# Patient Record
Sex: Female | Born: 1945 | Race: White | Hispanic: No | Smoking: Never smoker
Health system: Southern US, Community
[De-identification: ages and names within clinical notes are randomized; demographics above are authoritative.]

## PROBLEM LIST (undated history)

## (undated) DIAGNOSIS — E7800 Pure hypercholesterolemia, unspecified: Secondary | ICD-10-CM

## (undated) DIAGNOSIS — I10 Essential (primary) hypertension: Secondary | ICD-10-CM

## (undated) DIAGNOSIS — F32A Depression, unspecified: Secondary | ICD-10-CM

## (undated) DIAGNOSIS — F329 Major depressive disorder, single episode, unspecified: Secondary | ICD-10-CM

## (undated) DIAGNOSIS — J302 Other seasonal allergic rhinitis: Secondary | ICD-10-CM

## (undated) DIAGNOSIS — R195 Other fecal abnormalities: Secondary | ICD-10-CM

## (undated) HISTORY — DX: Essential (primary) hypertension: I10

## (undated) HISTORY — PX: OTHER SURGICAL HISTORY: SHX169

## (undated) HISTORY — DX: Other fecal abnormalities: R19.5

## (undated) HISTORY — PX: LAPAROSCOPY: SHX197

## (undated) HISTORY — PX: BREAST EXCISIONAL BIOPSY: SUR124

---

## 2007-08-02 ENCOUNTER — Ambulatory Visit (HOSPITAL_COMMUNITY): Admission: RE | Admit: 2007-08-02 | Discharge: 2007-08-02 | Payer: Self-pay | Admitting: Family Medicine

## 2007-12-28 ENCOUNTER — Encounter: Admission: RE | Admit: 2007-12-28 | Discharge: 2007-12-28 | Payer: Self-pay | Admitting: Otolaryngology

## 2007-12-28 ENCOUNTER — Other Ambulatory Visit: Admission: RE | Admit: 2007-12-28 | Discharge: 2007-12-28 | Payer: Self-pay | Admitting: Interventional Radiology

## 2007-12-28 ENCOUNTER — Encounter (INDEPENDENT_AMBULATORY_CARE_PROVIDER_SITE_OTHER): Payer: Self-pay | Admitting: Interventional Radiology

## 2010-05-07 ENCOUNTER — Encounter: Admission: RE | Admit: 2010-05-07 | Discharge: 2010-05-07 | Payer: Self-pay | Admitting: Family Medicine

## 2010-11-10 ENCOUNTER — Ambulatory Visit: Payer: BC Managed Care – PPO | Attending: Orthopaedic Surgery | Admitting: Physical Therapy

## 2010-11-10 DIAGNOSIS — IMO0001 Reserved for inherently not codable concepts without codable children: Secondary | ICD-10-CM | POA: Insufficient documentation

## 2010-11-10 DIAGNOSIS — M25669 Stiffness of unspecified knee, not elsewhere classified: Secondary | ICD-10-CM | POA: Insufficient documentation

## 2010-11-10 DIAGNOSIS — R5381 Other malaise: Secondary | ICD-10-CM | POA: Insufficient documentation

## 2010-11-10 DIAGNOSIS — M25569 Pain in unspecified knee: Secondary | ICD-10-CM | POA: Insufficient documentation

## 2010-11-13 ENCOUNTER — Ambulatory Visit: Payer: BC Managed Care – PPO | Admitting: Physical Therapy

## 2010-11-17 ENCOUNTER — Encounter: Payer: BC Managed Care – PPO | Admitting: Physical Therapy

## 2010-11-19 ENCOUNTER — Encounter: Payer: BC Managed Care – PPO | Admitting: Physical Therapy

## 2011-04-14 ENCOUNTER — Other Ambulatory Visit: Payer: Self-pay | Admitting: Family Medicine

## 2011-04-14 DIAGNOSIS — Z1231 Encounter for screening mammogram for malignant neoplasm of breast: Secondary | ICD-10-CM

## 2011-05-11 ENCOUNTER — Ambulatory Visit
Admission: RE | Admit: 2011-05-11 | Discharge: 2011-05-11 | Disposition: A | Discharge status: Home / Self Care | Payer: BC Managed Care – PPO | Source: Ambulatory Visit | Attending: Family Medicine | Admitting: Family Medicine

## 2011-05-11 DIAGNOSIS — Z1231 Encounter for screening mammogram for malignant neoplasm of breast: Secondary | ICD-10-CM

## 2012-04-06 ENCOUNTER — Other Ambulatory Visit: Payer: Self-pay | Admitting: Family Medicine

## 2012-04-06 DIAGNOSIS — Z1231 Encounter for screening mammogram for malignant neoplasm of breast: Secondary | ICD-10-CM

## 2012-05-11 ENCOUNTER — Ambulatory Visit
Admission: RE | Admit: 2012-05-11 | Discharge: 2012-05-11 | Disposition: A | Discharge status: Home / Self Care | Payer: BC Managed Care – PPO | Source: Ambulatory Visit | Attending: Family Medicine | Admitting: Family Medicine

## 2012-05-11 DIAGNOSIS — Z1231 Encounter for screening mammogram for malignant neoplasm of breast: Secondary | ICD-10-CM

## 2012-10-18 DIAGNOSIS — E559 Vitamin D deficiency, unspecified: Secondary | ICD-10-CM | POA: Diagnosis not present

## 2012-10-18 DIAGNOSIS — E7800 Pure hypercholesterolemia, unspecified: Secondary | ICD-10-CM | POA: Diagnosis not present

## 2012-10-18 DIAGNOSIS — D529 Folate deficiency anemia, unspecified: Secondary | ICD-10-CM | POA: Diagnosis not present

## 2012-10-18 DIAGNOSIS — R002 Palpitations: Secondary | ICD-10-CM | POA: Diagnosis not present

## 2012-10-18 DIAGNOSIS — R5383 Other fatigue: Secondary | ICD-10-CM | POA: Diagnosis not present

## 2012-10-18 DIAGNOSIS — I10 Essential (primary) hypertension: Secondary | ICD-10-CM | POA: Diagnosis not present

## 2012-10-18 DIAGNOSIS — E539 Vitamin B deficiency, unspecified: Secondary | ICD-10-CM | POA: Diagnosis not present

## 2012-10-24 DIAGNOSIS — E7800 Pure hypercholesterolemia, unspecified: Secondary | ICD-10-CM | POA: Diagnosis not present

## 2012-10-24 DIAGNOSIS — I10 Essential (primary) hypertension: Secondary | ICD-10-CM | POA: Diagnosis not present

## 2012-10-24 DIAGNOSIS — J309 Allergic rhinitis, unspecified: Secondary | ICD-10-CM | POA: Diagnosis not present

## 2012-10-24 DIAGNOSIS — R5383 Other fatigue: Secondary | ICD-10-CM | POA: Diagnosis not present

## 2012-10-24 DIAGNOSIS — R5381 Other malaise: Secondary | ICD-10-CM | POA: Diagnosis not present

## 2013-02-22 DIAGNOSIS — I10 Essential (primary) hypertension: Secondary | ICD-10-CM | POA: Diagnosis not present

## 2013-02-22 DIAGNOSIS — E7800 Pure hypercholesterolemia, unspecified: Secondary | ICD-10-CM | POA: Diagnosis not present

## 2013-03-06 DIAGNOSIS — J309 Allergic rhinitis, unspecified: Secondary | ICD-10-CM | POA: Diagnosis not present

## 2013-03-06 DIAGNOSIS — I10 Essential (primary) hypertension: Secondary | ICD-10-CM | POA: Diagnosis not present

## 2013-03-06 DIAGNOSIS — E7800 Pure hypercholesterolemia, unspecified: Secondary | ICD-10-CM | POA: Diagnosis not present

## 2013-03-06 DIAGNOSIS — R5381 Other malaise: Secondary | ICD-10-CM | POA: Diagnosis not present

## 2013-03-06 DIAGNOSIS — Z23 Encounter for immunization: Secondary | ICD-10-CM | POA: Diagnosis not present

## 2013-03-07 ENCOUNTER — Other Ambulatory Visit (HOSPITAL_COMMUNITY): Payer: Self-pay | Admitting: General Practice

## 2013-03-07 DIAGNOSIS — M8580 Other specified disorders of bone density and structure, unspecified site: Secondary | ICD-10-CM

## 2013-03-13 ENCOUNTER — Other Ambulatory Visit (HOSPITAL_COMMUNITY): Payer: BC Managed Care – PPO

## 2013-03-28 ENCOUNTER — Ambulatory Visit (HOSPITAL_COMMUNITY)
Admission: RE | Admit: 2013-03-28 | Discharge: 2013-03-28 | Disposition: A | Discharge status: Home / Self Care | Payer: Medicare Other | Source: Ambulatory Visit | Attending: General Practice | Admitting: General Practice

## 2013-03-28 DIAGNOSIS — Z780 Asymptomatic menopausal state: Secondary | ICD-10-CM | POA: Diagnosis not present

## 2013-03-28 DIAGNOSIS — Z13820 Encounter for screening for osteoporosis: Secondary | ICD-10-CM | POA: Diagnosis not present

## 2013-03-28 DIAGNOSIS — M8580 Other specified disorders of bone density and structure, unspecified site: Secondary | ICD-10-CM

## 2013-04-13 ENCOUNTER — Other Ambulatory Visit: Payer: Self-pay

## 2013-04-13 DIAGNOSIS — Z1231 Encounter for screening mammogram for malignant neoplasm of breast: Secondary | ICD-10-CM

## 2013-05-08 DIAGNOSIS — H8110 Benign paroxysmal vertigo, unspecified ear: Secondary | ICD-10-CM | POA: Diagnosis not present

## 2013-05-14 ENCOUNTER — Ambulatory Visit
Admission: RE | Admit: 2013-05-14 | Discharge: 2013-05-14 | Disposition: A | Discharge status: Home / Self Care | Payer: Medicare Other | Source: Ambulatory Visit

## 2013-05-14 DIAGNOSIS — Z1231 Encounter for screening mammogram for malignant neoplasm of breast: Secondary | ICD-10-CM | POA: Diagnosis not present

## 2013-07-13 DIAGNOSIS — R5381 Other malaise: Secondary | ICD-10-CM | POA: Diagnosis not present

## 2013-07-13 DIAGNOSIS — R5383 Other fatigue: Secondary | ICD-10-CM | POA: Diagnosis not present

## 2013-07-13 DIAGNOSIS — E7800 Pure hypercholesterolemia, unspecified: Secondary | ICD-10-CM | POA: Diagnosis not present

## 2013-07-13 DIAGNOSIS — I10 Essential (primary) hypertension: Secondary | ICD-10-CM | POA: Diagnosis not present

## 2013-07-20 DIAGNOSIS — E7800 Pure hypercholesterolemia, unspecified: Secondary | ICD-10-CM | POA: Diagnosis not present

## 2013-07-20 DIAGNOSIS — N189 Chronic kidney disease, unspecified: Secondary | ICD-10-CM | POA: Diagnosis not present

## 2013-07-20 DIAGNOSIS — I10 Essential (primary) hypertension: Secondary | ICD-10-CM | POA: Diagnosis not present

## 2013-07-20 DIAGNOSIS — R5383 Other fatigue: Secondary | ICD-10-CM | POA: Diagnosis not present

## 2013-07-20 DIAGNOSIS — R5381 Other malaise: Secondary | ICD-10-CM | POA: Diagnosis not present

## 2013-07-20 DIAGNOSIS — J309 Allergic rhinitis, unspecified: Secondary | ICD-10-CM | POA: Diagnosis not present

## 2013-08-06 DIAGNOSIS — R059 Cough, unspecified: Secondary | ICD-10-CM | POA: Diagnosis not present

## 2013-08-06 DIAGNOSIS — J019 Acute sinusitis, unspecified: Secondary | ICD-10-CM | POA: Diagnosis not present

## 2013-08-06 DIAGNOSIS — R05 Cough: Secondary | ICD-10-CM | POA: Diagnosis not present

## 2013-10-19 DIAGNOSIS — E7800 Pure hypercholesterolemia, unspecified: Secondary | ICD-10-CM | POA: Diagnosis not present

## 2013-10-19 DIAGNOSIS — N189 Chronic kidney disease, unspecified: Secondary | ICD-10-CM | POA: Diagnosis not present

## 2013-10-19 DIAGNOSIS — I10 Essential (primary) hypertension: Secondary | ICD-10-CM | POA: Diagnosis not present

## 2013-11-09 DIAGNOSIS — J309 Allergic rhinitis, unspecified: Secondary | ICD-10-CM | POA: Diagnosis not present

## 2013-11-09 DIAGNOSIS — I10 Essential (primary) hypertension: Secondary | ICD-10-CM | POA: Diagnosis not present

## 2013-11-09 DIAGNOSIS — R5383 Other fatigue: Secondary | ICD-10-CM | POA: Diagnosis not present

## 2013-11-09 DIAGNOSIS — R5381 Other malaise: Secondary | ICD-10-CM | POA: Diagnosis not present

## 2013-11-09 DIAGNOSIS — E7800 Pure hypercholesterolemia, unspecified: Secondary | ICD-10-CM | POA: Diagnosis not present

## 2013-11-09 DIAGNOSIS — N189 Chronic kidney disease, unspecified: Secondary | ICD-10-CM | POA: Diagnosis not present

## 2014-01-02 DIAGNOSIS — M25579 Pain in unspecified ankle and joints of unspecified foot: Secondary | ICD-10-CM | POA: Diagnosis not present

## 2014-01-02 DIAGNOSIS — S93419A Sprain of calcaneofibular ligament of unspecified ankle, initial encounter: Secondary | ICD-10-CM | POA: Diagnosis not present

## 2014-03-15 DIAGNOSIS — R5383 Other fatigue: Secondary | ICD-10-CM | POA: Diagnosis not present

## 2014-03-15 DIAGNOSIS — E7800 Pure hypercholesterolemia, unspecified: Secondary | ICD-10-CM | POA: Diagnosis not present

## 2014-03-15 DIAGNOSIS — R5381 Other malaise: Secondary | ICD-10-CM | POA: Diagnosis not present

## 2014-03-15 DIAGNOSIS — I10 Essential (primary) hypertension: Secondary | ICD-10-CM | POA: Diagnosis not present

## 2014-03-29 DIAGNOSIS — E780 Pure hypercholesterolemia: Secondary | ICD-10-CM | POA: Diagnosis not present

## 2014-03-29 DIAGNOSIS — I10 Essential (primary) hypertension: Secondary | ICD-10-CM | POA: Diagnosis not present

## 2014-03-29 DIAGNOSIS — R5383 Other fatigue: Secondary | ICD-10-CM | POA: Diagnosis not present

## 2014-03-29 DIAGNOSIS — Z23 Encounter for immunization: Secondary | ICD-10-CM | POA: Diagnosis not present

## 2014-03-29 DIAGNOSIS — N189 Chronic kidney disease, unspecified: Secondary | ICD-10-CM | POA: Diagnosis not present

## 2014-03-29 DIAGNOSIS — J301 Allergic rhinitis due to pollen: Secondary | ICD-10-CM | POA: Diagnosis not present

## 2014-04-09 ENCOUNTER — Other Ambulatory Visit: Payer: Self-pay

## 2014-04-09 DIAGNOSIS — Z1231 Encounter for screening mammogram for malignant neoplasm of breast: Secondary | ICD-10-CM

## 2014-05-15 ENCOUNTER — Ambulatory Visit
Admission: RE | Admit: 2014-05-15 | Discharge: 2014-05-15 | Disposition: A | Discharge status: Home / Self Care | Payer: Medicare Other | Source: Ambulatory Visit

## 2014-05-15 DIAGNOSIS — Z1231 Encounter for screening mammogram for malignant neoplasm of breast: Secondary | ICD-10-CM | POA: Diagnosis not present

## 2014-07-19 DIAGNOSIS — H2513 Age-related nuclear cataract, bilateral: Secondary | ICD-10-CM | POA: Diagnosis not present

## 2014-07-19 DIAGNOSIS — E780 Pure hypercholesterolemia: Secondary | ICD-10-CM | POA: Diagnosis not present

## 2014-07-19 DIAGNOSIS — N189 Chronic kidney disease, unspecified: Secondary | ICD-10-CM | POA: Diagnosis not present

## 2014-07-19 DIAGNOSIS — I10 Essential (primary) hypertension: Secondary | ICD-10-CM | POA: Diagnosis not present

## 2014-07-19 DIAGNOSIS — H40033 Anatomical narrow angle, bilateral: Secondary | ICD-10-CM | POA: Diagnosis not present

## 2014-07-26 DIAGNOSIS — E782 Mixed hyperlipidemia: Secondary | ICD-10-CM | POA: Diagnosis not present

## 2014-07-26 DIAGNOSIS — J301 Allergic rhinitis due to pollen: Secondary | ICD-10-CM | POA: Diagnosis not present

## 2014-07-26 DIAGNOSIS — Z1389 Encounter for screening for other disorder: Secondary | ICD-10-CM | POA: Diagnosis not present

## 2014-07-26 DIAGNOSIS — I10 Essential (primary) hypertension: Secondary | ICD-10-CM | POA: Diagnosis not present

## 2014-07-26 DIAGNOSIS — N189 Chronic kidney disease, unspecified: Secondary | ICD-10-CM | POA: Diagnosis not present

## 2014-07-26 DIAGNOSIS — R5383 Other fatigue: Secondary | ICD-10-CM | POA: Diagnosis not present

## 2014-08-26 DIAGNOSIS — Z01419 Encounter for gynecological examination (general) (routine) without abnormal findings: Secondary | ICD-10-CM | POA: Diagnosis not present

## 2014-08-26 DIAGNOSIS — N951 Menopausal and female climacteric states: Secondary | ICD-10-CM | POA: Diagnosis not present

## 2014-08-26 DIAGNOSIS — Z124 Encounter for screening for malignant neoplasm of cervix: Secondary | ICD-10-CM | POA: Diagnosis not present

## 2014-11-21 DIAGNOSIS — N189 Chronic kidney disease, unspecified: Secondary | ICD-10-CM | POA: Diagnosis not present

## 2014-11-21 DIAGNOSIS — R5383 Other fatigue: Secondary | ICD-10-CM | POA: Diagnosis not present

## 2014-11-21 DIAGNOSIS — E782 Mixed hyperlipidemia: Secondary | ICD-10-CM | POA: Diagnosis not present

## 2014-11-21 DIAGNOSIS — I10 Essential (primary) hypertension: Secondary | ICD-10-CM | POA: Diagnosis not present

## 2014-11-22 DIAGNOSIS — E782 Mixed hyperlipidemia: Secondary | ICD-10-CM | POA: Diagnosis not present

## 2014-11-29 DIAGNOSIS — N189 Chronic kidney disease, unspecified: Secondary | ICD-10-CM | POA: Diagnosis not present

## 2014-11-29 DIAGNOSIS — R5383 Other fatigue: Secondary | ICD-10-CM | POA: Diagnosis not present

## 2014-11-29 DIAGNOSIS — I10 Essential (primary) hypertension: Secondary | ICD-10-CM | POA: Diagnosis not present

## 2014-11-29 DIAGNOSIS — J301 Allergic rhinitis due to pollen: Secondary | ICD-10-CM | POA: Diagnosis not present

## 2014-11-29 DIAGNOSIS — E782 Mixed hyperlipidemia: Secondary | ICD-10-CM | POA: Diagnosis not present

## 2015-02-04 DIAGNOSIS — R05 Cough: Secondary | ICD-10-CM | POA: Diagnosis not present

## 2015-02-04 DIAGNOSIS — J189 Pneumonia, unspecified organism: Secondary | ICD-10-CM | POA: Diagnosis not present

## 2015-02-21 DIAGNOSIS — R5383 Other fatigue: Secondary | ICD-10-CM | POA: Diagnosis not present

## 2015-02-21 DIAGNOSIS — J189 Pneumonia, unspecified organism: Secondary | ICD-10-CM | POA: Diagnosis not present

## 2015-02-21 DIAGNOSIS — R05 Cough: Secondary | ICD-10-CM | POA: Diagnosis not present

## 2015-02-21 DIAGNOSIS — F331 Major depressive disorder, recurrent, moderate: Secondary | ICD-10-CM | POA: Diagnosis not present

## 2015-03-21 DIAGNOSIS — I10 Essential (primary) hypertension: Secondary | ICD-10-CM | POA: Diagnosis not present

## 2015-03-21 DIAGNOSIS — E782 Mixed hyperlipidemia: Secondary | ICD-10-CM | POA: Diagnosis not present

## 2015-03-21 DIAGNOSIS — N189 Chronic kidney disease, unspecified: Secondary | ICD-10-CM | POA: Diagnosis not present

## 2015-03-21 DIAGNOSIS — Z13220 Encounter for screening for lipoid disorders: Secondary | ICD-10-CM | POA: Diagnosis not present

## 2015-03-21 DIAGNOSIS — E780 Pure hypercholesterolemia: Secondary | ICD-10-CM | POA: Diagnosis not present

## 2015-03-28 DIAGNOSIS — E782 Mixed hyperlipidemia: Secondary | ICD-10-CM | POA: Diagnosis not present

## 2015-03-28 DIAGNOSIS — Z23 Encounter for immunization: Secondary | ICD-10-CM | POA: Diagnosis not present

## 2015-03-28 DIAGNOSIS — J301 Allergic rhinitis due to pollen: Secondary | ICD-10-CM | POA: Diagnosis not present

## 2015-03-28 DIAGNOSIS — I10 Essential (primary) hypertension: Secondary | ICD-10-CM | POA: Diagnosis not present

## 2015-03-28 DIAGNOSIS — N189 Chronic kidney disease, unspecified: Secondary | ICD-10-CM | POA: Diagnosis not present

## 2015-03-28 DIAGNOSIS — R5383 Other fatigue: Secondary | ICD-10-CM | POA: Diagnosis not present

## 2015-04-18 ENCOUNTER — Other Ambulatory Visit: Payer: Self-pay

## 2015-04-18 DIAGNOSIS — Z1231 Encounter for screening mammogram for malignant neoplasm of breast: Secondary | ICD-10-CM

## 2015-05-27 ENCOUNTER — Ambulatory Visit
Admission: RE | Admit: 2015-05-27 | Discharge: 2015-05-27 | Disposition: A | Discharge status: Home / Self Care | Payer: Medicare Other | Source: Ambulatory Visit

## 2015-05-27 DIAGNOSIS — Z1231 Encounter for screening mammogram for malignant neoplasm of breast: Secondary | ICD-10-CM | POA: Diagnosis not present

## 2015-06-10 DIAGNOSIS — J0101 Acute recurrent maxillary sinusitis: Secondary | ICD-10-CM | POA: Diagnosis not present

## 2015-08-01 DIAGNOSIS — N189 Chronic kidney disease, unspecified: Secondary | ICD-10-CM | POA: Diagnosis not present

## 2015-08-01 DIAGNOSIS — E782 Mixed hyperlipidemia: Secondary | ICD-10-CM | POA: Diagnosis not present

## 2015-08-01 DIAGNOSIS — E7800 Pure hypercholesterolemia, unspecified: Secondary | ICD-10-CM | POA: Diagnosis not present

## 2015-08-01 DIAGNOSIS — I10 Essential (primary) hypertension: Secondary | ICD-10-CM | POA: Diagnosis not present

## 2015-08-01 DIAGNOSIS — F331 Major depressive disorder, recurrent, moderate: Secondary | ICD-10-CM | POA: Diagnosis not present

## 2015-08-08 DIAGNOSIS — J301 Allergic rhinitis due to pollen: Secondary | ICD-10-CM | POA: Diagnosis not present

## 2015-08-08 DIAGNOSIS — R5383 Other fatigue: Secondary | ICD-10-CM | POA: Diagnosis not present

## 2015-08-08 DIAGNOSIS — I10 Essential (primary) hypertension: Secondary | ICD-10-CM | POA: Diagnosis not present

## 2015-08-08 DIAGNOSIS — E782 Mixed hyperlipidemia: Secondary | ICD-10-CM | POA: Diagnosis not present

## 2015-08-08 DIAGNOSIS — N189 Chronic kidney disease, unspecified: Secondary | ICD-10-CM | POA: Diagnosis not present

## 2015-10-31 DIAGNOSIS — Z124 Encounter for screening for malignant neoplasm of cervix: Secondary | ICD-10-CM | POA: Diagnosis not present

## 2015-11-28 DIAGNOSIS — I10 Essential (primary) hypertension: Secondary | ICD-10-CM | POA: Diagnosis not present

## 2015-11-28 DIAGNOSIS — R5383 Other fatigue: Secondary | ICD-10-CM | POA: Diagnosis not present

## 2015-11-28 DIAGNOSIS — E782 Mixed hyperlipidemia: Secondary | ICD-10-CM | POA: Diagnosis not present

## 2015-11-28 DIAGNOSIS — N189 Chronic kidney disease, unspecified: Secondary | ICD-10-CM | POA: Diagnosis not present

## 2015-12-05 DIAGNOSIS — Z1212 Encounter for screening for malignant neoplasm of rectum: Secondary | ICD-10-CM | POA: Diagnosis not present

## 2015-12-05 DIAGNOSIS — Z1389 Encounter for screening for other disorder: Secondary | ICD-10-CM | POA: Diagnosis not present

## 2015-12-05 DIAGNOSIS — R5383 Other fatigue: Secondary | ICD-10-CM | POA: Diagnosis not present

## 2015-12-05 DIAGNOSIS — N189 Chronic kidney disease, unspecified: Secondary | ICD-10-CM | POA: Diagnosis not present

## 2015-12-05 DIAGNOSIS — J301 Allergic rhinitis due to pollen: Secondary | ICD-10-CM | POA: Diagnosis not present

## 2015-12-05 DIAGNOSIS — I10 Essential (primary) hypertension: Secondary | ICD-10-CM | POA: Diagnosis not present

## 2015-12-05 DIAGNOSIS — E782 Mixed hyperlipidemia: Secondary | ICD-10-CM | POA: Diagnosis not present

## 2016-02-19 ENCOUNTER — Other Ambulatory Visit: Payer: Self-pay

## 2016-03-26 DIAGNOSIS — I10 Essential (primary) hypertension: Secondary | ICD-10-CM | POA: Diagnosis not present

## 2016-03-26 DIAGNOSIS — E782 Mixed hyperlipidemia: Secondary | ICD-10-CM | POA: Diagnosis not present

## 2016-03-26 DIAGNOSIS — N189 Chronic kidney disease, unspecified: Secondary | ICD-10-CM | POA: Diagnosis not present

## 2016-03-26 DIAGNOSIS — E7800 Pure hypercholesterolemia, unspecified: Secondary | ICD-10-CM | POA: Diagnosis not present

## 2016-04-02 DIAGNOSIS — N189 Chronic kidney disease, unspecified: Secondary | ICD-10-CM | POA: Diagnosis not present

## 2016-04-02 DIAGNOSIS — Z1212 Encounter for screening for malignant neoplasm of rectum: Secondary | ICD-10-CM | POA: Diagnosis not present

## 2016-04-02 DIAGNOSIS — E782 Mixed hyperlipidemia: Secondary | ICD-10-CM | POA: Diagnosis not present

## 2016-04-02 DIAGNOSIS — J301 Allergic rhinitis due to pollen: Secondary | ICD-10-CM | POA: Diagnosis not present

## 2016-04-02 DIAGNOSIS — Z23 Encounter for immunization: Secondary | ICD-10-CM | POA: Diagnosis not present

## 2016-04-02 DIAGNOSIS — I10 Essential (primary) hypertension: Secondary | ICD-10-CM | POA: Diagnosis not present

## 2016-04-02 DIAGNOSIS — R5383 Other fatigue: Secondary | ICD-10-CM | POA: Diagnosis not present

## 2016-04-02 DIAGNOSIS — Z6827 Body mass index (BMI) 27.0-27.9, adult: Secondary | ICD-10-CM | POA: Diagnosis not present

## 2016-04-20 ENCOUNTER — Other Ambulatory Visit: Payer: Self-pay | Admitting: General Practice

## 2016-04-20 DIAGNOSIS — Z1231 Encounter for screening mammogram for malignant neoplasm of breast: Secondary | ICD-10-CM

## 2016-05-28 ENCOUNTER — Ambulatory Visit
Admission: RE | Admit: 2016-05-28 | Discharge: 2016-05-28 | Disposition: A | Discharge status: Home / Self Care | Payer: Medicare Other | Source: Ambulatory Visit | Attending: General Practice | Admitting: General Practice

## 2016-05-28 DIAGNOSIS — Z1231 Encounter for screening mammogram for malignant neoplasm of breast: Secondary | ICD-10-CM | POA: Diagnosis not present

## 2016-06-02 ENCOUNTER — Ambulatory Visit (INDEPENDENT_AMBULATORY_CARE_PROVIDER_SITE_OTHER): Payer: Medicare Other

## 2016-06-02 ENCOUNTER — Encounter (INDEPENDENT_AMBULATORY_CARE_PROVIDER_SITE_OTHER): Payer: Self-pay | Admitting: Orthopedic Surgery

## 2016-06-02 ENCOUNTER — Ambulatory Visit (INDEPENDENT_AMBULATORY_CARE_PROVIDER_SITE_OTHER): Payer: Medicare Other | Admitting: Orthopedic Surgery

## 2016-06-02 VITALS — BP 162/74 | HR 74 | Resp 16 | Ht 66.0 in | Wt 172.0 lb

## 2016-06-02 DIAGNOSIS — G8929 Other chronic pain: Secondary | ICD-10-CM | POA: Diagnosis not present

## 2016-06-02 DIAGNOSIS — M25571 Pain in right ankle and joints of right foot: Secondary | ICD-10-CM

## 2016-06-02 DIAGNOSIS — M25561 Pain in right knee: Secondary | ICD-10-CM

## 2016-06-02 DIAGNOSIS — M1711 Unilateral primary osteoarthritis, right knee: Secondary | ICD-10-CM | POA: Diagnosis not present

## 2016-06-02 DIAGNOSIS — M19071 Primary osteoarthritis, right ankle and foot: Secondary | ICD-10-CM | POA: Diagnosis not present

## 2016-06-02 DIAGNOSIS — M8569 Other cyst of bone, multiple sites: Secondary | ICD-10-CM

## 2016-06-02 MED ORDER — LIDOCAINE HCL 1 % IJ SOLN
5.0000 mL | INTRAMUSCULAR | Status: AC | PRN
  Administered 2016-06-02: 5 mL

## 2016-06-02 MED ORDER — BUPIVACAINE HCL 0.5 % IJ SOLN
3.0000 mL | INTRAMUSCULAR | Status: AC | PRN
  Administered 2016-06-02: 3 mL via INTRA_ARTICULAR

## 2016-06-02 MED ORDER — METHYLPREDNISOLONE ACETATE 40 MG/ML IJ SUSP
80.0000 mg | INTRAMUSCULAR | Status: AC | PRN
  Administered 2016-06-02: 80 mg

## 2016-06-02 NOTE — Progress Notes (Signed)
Office Visit Note   Patient: Becky Galvan           Date of Birth: 02/17/1946           MRN: [id number] Visit Date: 06/02/2016              Requested by: Becky SlatesAmy H Boyd, PA-C [facility] [address] [place], [place] [address] PCP: Becky SlatesBoyd, Becky H, PA-C   Assessment & Plan: Visit Diagnoses:  1. Unilateral primary osteoarthritis, right knee   2. Chronic pain of right knee   3. Pain in joint involving right ankle and foot   4. Primary osteoarthritis, right ankle and foot   5. Cyst, bone local     Plan:  #1: Corticosteroid injection to the right knee #2: Arch supports to right foot #3: MRI scan right ankle evaluation of the cyst of the distal fibula #4: Voltaren gel description for use on right knee as well as both hands and ankle #5: Follow up in 2 weeks after MRI scan   Follow-Up Instructions: Return in about 2 weeks (around 06/16/2016) for review of mri.   Orders:  Orders Placed This Encounter  Procedures  . Large Joint Injection/Arthrocentesis  . XR Ankle Complete Right  . XR Knee Complete 4 Views Right  . XR Foot Complete Right  . MR Ankle Right w/o contrast   No orders of the defined types were placed in this encounter.     Procedures: Large Joint Inj Date/Time: 06/02/2016 10:26 AM Performed by: Becky Galvan, Becky Galvan Authorized by: Becky Galvan, Becky Galvan   Consent Given by:  Patient Timeout: prior to procedure the correct patient, procedure, and site was verified   Indications:  Pain and joint swelling Location:  Knee Site:  R knee Prep: patient was prepped and draped in usual sterile fashion   Needle Size:  25 G Needle Length:  1.5 inches Approach:  Anteromedial Ultrasound Guidance: No   Fluoroscopic Guidance: No   Arthrogram: No   Medications:  5 mL lidocaine 1 %; 80 mg methylPREDNISolone acetate 40 MG/ML; 3 mL bupivacaine 0.5 % Aspiration Attempted: No   Patient tolerance:  Patient tolerated the procedure well with no immediate complications     Clinical  Data: No additional findings.   Subjective: Chief Complaint  Patient presents with  . Right Knee - Pain  . Right Ankle - Pain    [name] is a very pleasant 70 year old female here for 2 complaints. She is stating that she is had problems with giving way symptoms of her right ankle as well as pain and posterior right knee pain.  In regards to her right ankle she states she has feeling of giving way in the ankle. She complains mainly of medial ankle and foot pain. Also has some complaints of swelling in the lateral aspect of her ankle.  History is that in around 2013 he had rolled her right ankle and had had some recurrent minor injuries. For in July 2015 she was stepping off a curb at that time had a great deal of pain laterally. She did use [employer] consults that time. She continues to have symptoms in the ankle and does wear an arch support but only she gets up in the right place. Her pain appears more centered along the medial aspect of the ankle but does have some laterally according to her.  In regards to her right knee she did have an urgent care visit back in 07/18/2010. History at that time  as she started walking and started having pain in entire right knee. She does not have any history of injury or trauma that time. Cortical steroid injection was given on 06/28/2010. She started having increased symptoms and an MRI scan was ordered April 2012. This revealed diffuse thinning of the articular cartilage medial compartment with marginal spur formation and peripheral subluxation of medial meniscus. There was an oblique tear of the superior surface of the posterior horn medial meniscus at the posterior medial corner extending into the mid body. There is also some diffuse thinning of the articular cartilage of the patella with minimal chondromalacia lateral compartment. 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 A complex tear of the posterior half of the meniscus which was debrided. Lateral compartment had grade 2 chondromalacia of the medial femoral condyle. She had done well until most recently in the last 2 years he started having pain mainly in the posterior aspect of the knee. Seen today for evaluation.  She also complains of bilateral hand stiffness and pain. Apparently some hereditary osteoarthritis of the hands in the family.  Review of Systems  Constitutional: Negative.   HENT: Negative.   Respiratory: Negative.   Cardiovascular:       Hypertension with occasional ectopics  Gastrointestinal: Negative.   Genitourinary: Negative.   Skin: Negative.   Neurological: Negative.   Hematological: Negative.   Psychiatric/Behavioral: Negative.      Objective: Vital Signs: BP (!) 162/74   Pulse 74   Resp 16   Ht 5\' 6"  (1.676 m)   Wt 172 lb (78 kg)   BMI 27.76 kg/m   Physical Exam  Constitutional: She is oriented to person, place, and time. She appears well-developed and well-nourished.  HENT:  Head: Normocephalic and atraumatic.  Eyes: EOM are normal. Pupils are equal, round, and reactive to light.  Neck:  No carotid bruits  Pulmonary/Chest: Effort normal.  Musculoskeletal:       Right knee: She exhibits effusion.  Neurological: She is alert and oriented to person, place, and time.  Skin: Skin is warm and dry.  Psychiatric: She has a normal mood and affect. Her behavior is normal. Judgment and thought content normal.    Right Ankle Exam  Swelling: mild  Tenderness  The patient is experiencing tenderness in the medial malleolus, lateral malleolus and deltoid.    Range of Motion  Dorsiflexion: 10  Plantar flexion: 25   Muscle Strength  The patient has normal right ankle strength.  Tests  Anterior drawer: negative Varus tilt: negative  Other  Sensation: normal Pulse: present    Right Knee Exam    Tenderness  Right knee tenderness location: More posteriorly and medially.  Range of Motion  Extension: 0  Flexion: 110   Tests  Lachman:  Anterior - trace     Drawer:       Anterior - trace      Valgus: positive (opens mildly with good endpoint)  Other  Sensation: normal Pulse: present Swelling: mild Other tests: effusion present  Comments:  Positive McMurray's medial    Both hands reveal Heberden's nodes diffusely. Some swelling diffusely in the MCP and PIP joints of both hands.  Specialty Comments:  No specialty comments available.  Imaging: Xr Knee Complete 4 Views Right  Result Date: 06/02/2016 4 view knee films reveals the right knee to be essentially bone-on-bone medial  compartment with periarticular spurring more medial than lateral. March sclerosing more on the distal medial femoral condyle and the tibial plateau. Does have some patellofemoral and osteophytes with some cystic changes.   Xr Ankle Complete Right  Result Date: 06/02/2016 Three-view right ankle reveals joint space narrowing at the fibulotalar joint. There is a large cystic formation in the distal fibula noted to loculated. She has a Haglund's deformity. Tear spurring off the distal tibia on the lateral. Plantar calcaneal spur also noted  Xr Foot Complete Right  Result Date: 06/02/2016 Three-view right foot reveals talonavicular medial bone-on-bone. Some diffuse degenerative changes in the midfoot are also noted. Age at the first TMT is also noted. spurring off the medial portion of the navicular.    PMFS History: Patient Active Problem List   Diagnosis Date Noted  . Unilateral primary osteoarthritis, right knee 06/02/2016   Past Medical History:  Diagnosis Date  . Hypertension     History reviewed. No pertinent family history.  Past Surgical History:  Procedure Laterality Date  . KNEE ARTHROPLASTY     Social History   Occupational History  . Not on file.   Social History Main  Topics  . Smoking status: Never Smoker  . Smokeless tobacco: Never Used  . Alcohol use No  . Drug use: No  . Sexual activity: Not on file

## 2016-06-10 ENCOUNTER — Ambulatory Visit
Admission: RE | Admit: 2016-06-10 | Discharge: 2016-06-10 | Disposition: A | Discharge status: Home / Self Care | Payer: Medicare Other | Source: Ambulatory Visit | Attending: Orthopedic Surgery | Admitting: Orthopedic Surgery

## 2016-06-10 DIAGNOSIS — M25571 Pain in right ankle and joints of right foot: Secondary | ICD-10-CM | POA: Diagnosis not present

## 2016-06-18 ENCOUNTER — Ambulatory Visit (INDEPENDENT_AMBULATORY_CARE_PROVIDER_SITE_OTHER): Payer: Medicare Other | Admitting: Orthopaedic Surgery

## 2016-06-18 ENCOUNTER — Encounter (INDEPENDENT_AMBULATORY_CARE_PROVIDER_SITE_OTHER): Payer: Self-pay | Admitting: Orthopaedic Surgery

## 2016-06-18 ENCOUNTER — Telehealth (INDEPENDENT_AMBULATORY_CARE_PROVIDER_SITE_OTHER): Payer: Self-pay | Admitting: Orthopaedic Surgery

## 2016-06-18 ENCOUNTER — Other Ambulatory Visit (INDEPENDENT_AMBULATORY_CARE_PROVIDER_SITE_OTHER): Payer: Self-pay

## 2016-06-18 VITALS — BP 131/72 | HR 70 | Ht 66.0 in | Wt 172.0 lb

## 2016-06-18 DIAGNOSIS — M25571 Pain in right ankle and joints of right foot: Secondary | ICD-10-CM | POA: Diagnosis not present

## 2016-06-18 DIAGNOSIS — M1711 Unilateral primary osteoarthritis, right knee: Secondary | ICD-10-CM

## 2016-06-18 MED ORDER — DICLOFENAC SODIUM 1 % TD GEL: 2.0000 g | Freq: Four times a day (QID) | TRANSDERMAL | 2 refills | Status: DC

## 2016-06-18 NOTE — Progress Notes (Signed)
Office Visit Note   Patient: Becky Galvan           Date of Birth: 03/04/1946           MRN: [id number] Visit Date: 06/18/2016              Requested by: Encarnacion SlatesAmy H Boyd, PA-C [facility] [address] [place], [place] [id number] PCP: Encarnacion SlatesBoyd, Amy H, PA-C   Assessment & Plan: Visit Diagnoses: Osteoarthritis right knee with excellent response to recent steroid injection. 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   Plan: Continue to monitor her response to cortisone right knee. Consider repeat cortisone injections or Visco supplementation. Right ankle support and long discussion about different treatment options of chronic ankle problem per MRI scan Have discussed surgical intervention. This would include an arthroscopic evaluation of the right ankle joint and exploration of the peroneal tendons. We will monitor her response to ankle support and follow-up as necessary Follow-Up Instructions: No Follow-up on file.   Orders:  No orders of the defined types were placed in this encounter.  No orders of the defined types were placed in this encounter.     Procedures: No procedures performed   Clinical Data: No additional findings. MRI scan of right ankle demonstrates severe tendinosis of the peroneal brevis with a high grade partial thickness, near complete, tear and cystic expansion at the level of the lateral malleolus moderate amount of fluid in the peroneal tendon sheath. Small ankle joint effusion. Osteochondral lesion involving the medial and lateral corners of the talar dome with subchondral marrow edema. The medial osteochondral lesion measures 14 mm area and the lateral osteochondral lesion measures the same. Mild overlying chondromalacia without a full-thickness chondral defect. Ankle mortise is intact.  Subjective: No chief complaint on file.   Pt doing well with Right knee cortisone  injection. Right ankle still is sore, no swelling, good ROM  [name] has had a problem with her right ankle for, perhaps, 5 years area she's had prior ankle sprains.  Review of Systems   Objective: Vital Signs: Ht 5\' 6"  (1.676 m)   Wt 172 lb (78 kg)   BMI 27.76 kg/m   Physical Exam  Ortho Exam right knee exam without any specific pain along the medial lateral joint. Very minimal effusion. No evidence of instability.   Right ankle exam with minimal tenderness along the deltoid ligament. No pain behind medial malleolus. No obvious ankle effusion. Fullness along peroneal tendons just proximal to the lateral malleolus where there is a bulbous appearance of the peroneal tendon sheath. Tenderness along the peroneal tendons nearly to the base of the fifth metatarsal. Skin intact. Neurovascular exam is intact. No obvious instability.  Specialty Comments:  No specialty comments available.  Imaging: No results found.   PMFS History: Patient Active Problem List   Diagnosis Date Noted  . Unilateral primary osteoarthritis, right knee 06/02/2016   Past Medical History:  Diagnosis Date  . Hypertension     No family history on file.  Past Surgical History:  Procedure Laterality Date  . KNEE ARTHROPLASTY     Social History   Occupational History  . Not on file.   Social History Main Topics  . Smoking status: Never Smoker  . Smokeless tobacco: Never Used  . Alcohol use No  . Drug use: No  . Sexual activity: Not on file

## 2016-06-18 NOTE — Telephone Encounter (Signed)
Patient was seen today and meant to ask Dr. Cleophas DunkerWhitfield about an rx for Voltaren gel. Patient would like it sent to [facility]

## 2016-06-18 NOTE — Telephone Encounter (Signed)
OK 

## 2016-07-30 DIAGNOSIS — I10 Essential (primary) hypertension: Secondary | ICD-10-CM | POA: Diagnosis not present

## 2016-07-30 DIAGNOSIS — E782 Mixed hyperlipidemia: Secondary | ICD-10-CM | POA: Diagnosis not present

## 2016-08-10 DIAGNOSIS — I10 Essential (primary) hypertension: Secondary | ICD-10-CM | POA: Diagnosis not present

## 2016-08-10 DIAGNOSIS — R5383 Other fatigue: Secondary | ICD-10-CM | POA: Diagnosis not present

## 2016-08-10 DIAGNOSIS — N189 Chronic kidney disease, unspecified: Secondary | ICD-10-CM | POA: Diagnosis not present

## 2016-08-10 DIAGNOSIS — Z1212 Encounter for screening for malignant neoplasm of rectum: Secondary | ICD-10-CM | POA: Diagnosis not present

## 2016-08-10 DIAGNOSIS — J301 Allergic rhinitis due to pollen: Secondary | ICD-10-CM | POA: Diagnosis not present

## 2016-08-10 DIAGNOSIS — Z23 Encounter for immunization: Secondary | ICD-10-CM | POA: Diagnosis not present

## 2016-08-10 DIAGNOSIS — E782 Mixed hyperlipidemia: Secondary | ICD-10-CM | POA: Diagnosis not present

## 2016-11-11 ENCOUNTER — Ambulatory Visit (INDEPENDENT_AMBULATORY_CARE_PROVIDER_SITE_OTHER): Payer: Medicare Other | Admitting: Orthopaedic Surgery

## 2016-11-11 ENCOUNTER — Encounter (INDEPENDENT_AMBULATORY_CARE_PROVIDER_SITE_OTHER): Payer: Self-pay | Admitting: Orthopaedic Surgery

## 2016-11-11 ENCOUNTER — Telehealth (INDEPENDENT_AMBULATORY_CARE_PROVIDER_SITE_OTHER): Payer: Self-pay | Admitting: Orthopaedic Surgery

## 2016-11-11 ENCOUNTER — Other Ambulatory Visit (INDEPENDENT_AMBULATORY_CARE_PROVIDER_SITE_OTHER): Payer: Self-pay

## 2016-11-11 VITALS — BP 131/77 | HR 69 | Resp 14 | Ht 64.0 in | Wt 165.0 lb

## 2016-11-11 DIAGNOSIS — M25562 Pain in left knee: Secondary | ICD-10-CM | POA: Diagnosis not present

## 2016-11-11 MED ORDER — DICLOFENAC SODIUM 1 % TD GEL: 2.0000 g | Freq: Four times a day (QID) | TRANSDERMAL | 2 refills | Status: DC

## 2016-11-11 NOTE — Telephone Encounter (Signed)
done

## 2016-11-11 NOTE — Telephone Encounter (Signed)
Patient request a refill on Voltaren gel for her hands. [facility]. Please call patient when rx called in.

## 2016-11-11 NOTE — Telephone Encounter (Signed)
OK 

## 2016-11-11 NOTE — Progress Notes (Signed)
   Office Visit Note   Patient: Becky Galvan           Date of Birth: 03/28/1946           MRN: [id number] Visit Date: 11/11/2016              Requested by: Encarnacion SlatesBoyd, Amy H, PA-C [facility] [address] [address] [place], [place] [id number] PCP: Encarnacion SlatesBoyd, Amy H, PA-C   Assessment & Plan: Visit Diagnoses:  1. Acute pain of left knee   Probable hamstring pull left lower extremity. Does have some mild arthritis  in the knee  Plan: Ice or heat to the posterior aspect the left knee, felt 40 mg 3 times a day, Tylenol in between, office 1 week if no improvement  Follow-Up Instructions: Return if symptoms worsen or fail to improve.   Orders:  No orders of the defined types were placed in this encounter.  No orders of the defined types were placed in this encounter.     Procedures: No procedures performed   Clinical Data: No additional findings.   Subjective: Chief Complaint  Patient presents with  . Left Knee - Pain, Injury    Ms. Chestine SporeClark is a 71 y o that presents with Left knee pain from a misstep on Wednesday afternoon. She felt a "pull but no fall" in her L hamstring with L knee pain and some left sided hip pain. She put a knee sleeve on she already had at home. Some brui  [name] is localizing her pain in the distal hamstring popliteal area and proximal calf of her left lower extremity. There is no induration. She is feels like her knee may be a little bit tight. No back pain. No groin pain. No swelling distally. No neurologic deficit.  HPI  Review of Systems   Objective: Vital Signs: BP 131/77   Pulse 69   Resp 14   Ht 5\' 4"  (1.626 m)   Wt 165 lb (74.8 kg)   BMI 28.32 kg/m   Physical Exam  Ortho Exam straight leg raise negative bilaterally. Some tenderness along the posterior hamstrings medially and laterally near the popliteal region of her left knee. No ecchymosis. Extensor mechanism intact. No knee effusion. No medial medial lateral joint pain. No patella crepitation. Full  extension. Flexion over 100 without instability. No calf pain stents in no swelling distally neurovascular exam intact. No groin discomfort. No pain with range of motion of either hip     Imaging: No results found.   PMFS History: Patient Active Problem List   Diagnosis Date Noted  . Unilateral primary osteoarthritis, right knee 06/02/2016   Past Medical History:  Diagnosis Date  . Hypertension     History reviewed. No pertinent family history.  Past Surgical History:  Procedure Laterality Date  . KNEE ARTHROPLASTY     Social History   Occupational History  . Not on file.   Social History Main Topics  . Smoking status: Never Smoker  . Smokeless tobacco: Never Used  . Alcohol use No  . Drug use: No  . Sexual activity: Not on file     Valeria BatmanPeter W Darice Vicario, MD   Note - This record has been created using [employer] software.  Chart creation errors have been sought, but may not always  have been located. Such creation errors do not reflect on  the standard of medical care.

## 2016-11-11 NOTE — Telephone Encounter (Signed)
ok 

## 2016-12-14 DIAGNOSIS — I10 Essential (primary) hypertension: Secondary | ICD-10-CM | POA: Diagnosis not present

## 2016-12-14 DIAGNOSIS — E782 Mixed hyperlipidemia: Secondary | ICD-10-CM | POA: Diagnosis not present

## 2016-12-14 DIAGNOSIS — E7800 Pure hypercholesterolemia, unspecified: Secondary | ICD-10-CM | POA: Diagnosis not present

## 2016-12-14 DIAGNOSIS — N189 Chronic kidney disease, unspecified: Secondary | ICD-10-CM | POA: Diagnosis not present

## 2016-12-14 DIAGNOSIS — F331 Major depressive disorder, recurrent, moderate: Secondary | ICD-10-CM | POA: Diagnosis not present

## 2016-12-16 DIAGNOSIS — R350 Frequency of micturition: Secondary | ICD-10-CM | POA: Diagnosis not present

## 2016-12-16 DIAGNOSIS — Z124 Encounter for screening for malignant neoplasm of cervix: Secondary | ICD-10-CM | POA: Diagnosis not present

## 2016-12-17 DIAGNOSIS — Z1212 Encounter for screening for malignant neoplasm of rectum: Secondary | ICD-10-CM | POA: Diagnosis not present

## 2016-12-17 DIAGNOSIS — Z1389 Encounter for screening for other disorder: Secondary | ICD-10-CM | POA: Diagnosis not present

## 2016-12-17 DIAGNOSIS — R5383 Other fatigue: Secondary | ICD-10-CM | POA: Diagnosis not present

## 2016-12-17 DIAGNOSIS — E782 Mixed hyperlipidemia: Secondary | ICD-10-CM | POA: Diagnosis not present

## 2016-12-17 DIAGNOSIS — H04123 Dry eye syndrome of bilateral lacrimal glands: Secondary | ICD-10-CM | POA: Diagnosis not present

## 2016-12-17 DIAGNOSIS — N189 Chronic kidney disease, unspecified: Secondary | ICD-10-CM | POA: Diagnosis not present

## 2016-12-17 DIAGNOSIS — H2513 Age-related nuclear cataract, bilateral: Secondary | ICD-10-CM | POA: Diagnosis not present

## 2016-12-17 DIAGNOSIS — I10 Essential (primary) hypertension: Secondary | ICD-10-CM | POA: Diagnosis not present

## 2016-12-17 DIAGNOSIS — J301 Allergic rhinitis due to pollen: Secondary | ICD-10-CM | POA: Diagnosis not present

## 2016-12-30 ENCOUNTER — Encounter (INDEPENDENT_AMBULATORY_CARE_PROVIDER_SITE_OTHER): Payer: Self-pay | Admitting: Internal Medicine

## 2017-01-06 ENCOUNTER — Ambulatory Visit (INDEPENDENT_AMBULATORY_CARE_PROVIDER_SITE_OTHER): Payer: Medicare Other | Admitting: Internal Medicine

## 2017-01-06 ENCOUNTER — Encounter (INDEPENDENT_AMBULATORY_CARE_PROVIDER_SITE_OTHER): Payer: Self-pay | Admitting: Internal Medicine

## 2017-01-06 ENCOUNTER — Other Ambulatory Visit (INDEPENDENT_AMBULATORY_CARE_PROVIDER_SITE_OTHER): Payer: Self-pay | Admitting: Internal Medicine

## 2017-01-06 ENCOUNTER — Telehealth (INDEPENDENT_AMBULATORY_CARE_PROVIDER_SITE_OTHER): Payer: Self-pay | Admitting: *Deleted

## 2017-01-06 ENCOUNTER — Encounter (INDEPENDENT_AMBULATORY_CARE_PROVIDER_SITE_OTHER): Payer: Self-pay | Admitting: *Deleted

## 2017-01-06 DIAGNOSIS — R195 Other fecal abnormalities: Secondary | ICD-10-CM | POA: Diagnosis not present

## 2017-01-06 HISTORY — DX: Other fecal abnormalities: R19.5

## 2017-01-06 MED ORDER — PEG 3350-KCL-NA BICARB-NACL 420 G PO SOLR: 4000.0000 mL | Freq: Once | ORAL | 0 refills | Status: AC

## 2017-01-06 NOTE — Patient Instructions (Signed)
Colonoscopy. The risks of bleeding, perforation and infection were reviewed with patient.  

## 2017-01-06 NOTE — Telephone Encounter (Signed)
Patient needs trilyte 

## 2017-01-06 NOTE — Progress Notes (Addendum)
   Subjective:    Patient ID: Becky Galvan, female    DOB: 07/30/1945, 71 y.o.   MRN: [id number]  HPI  Referred by Roxine CaddyAmy Boyd PA-C for + stool card. Patient has not seen any blood in her stools. No change her stool habits. 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  Her appetite good. No weight loss. She does exercise fairly regularly. She exercises at the gym [employer](Bike, and the machines). Her last coloscopy at age 71 by Dr. Karilyn Cotaehman and she says it was normal  No family hx of colon cancer.   02/06/2009 Colonoscopy: Irregular bowel movements. Dr. Karilyn Cotaehman.: Normal terminal ileum. Normal colonoscopy except for external hemorrhoids.   Review of Systems Past Medical History:  Diagnosis Date  . Hypertension     Past Surgical History:  Procedure Laterality Date  . KNEE ARTHROPLASTY      Allergies  Allergen Reactions  . Lisinopril   . Sulfa Antibiotics     Current Outpatient Prescriptions on File Prior to Visit  Medication Sig Dispense Refill  . aspirin 81 MG chewable tablet Chew 81 mg by mouth daily.    . diclofenac sodium (VOLTAREN) 1 % GEL Apply 2 g topically 4 (four) times daily. 1 Tube 2  . metoprolol (LOPRESSOR) 50 MG tablet Take 50 mg by mouth 2 (two) times daily.    . sertraline (ZOLOFT) 50 MG tablet Take 50 mg by mouth daily.    . simvastatin (ZOCOR) 40 MG tablet Take 40 mg by mouth daily.     No current facility-administered medications on file prior to visit.         Objective:   Physical Exam Blood pressure 132/68, pulse 64, temperature 98.2 F (36.8 C), resp. rate 16, height 5\' 6"  (1.676 m), weight 175 lb 14.4 oz (79.8 kg). Alert and oriented. Skin warm and dry. Oral mucosa is moist.   . Sclera anicteric, conjunctivae is pink. Thyroid not enlarged. No cervical lymphadenopathy. Lungs clear. Heart regular rate and rhythm.  Abdomen is soft. Bowel sounds are positive. No hepatomegaly. No abdominal masses felt. No tenderness.  No edema to lower extremities.           Assessment &  Plan:  + stool card. Colonic neoplasm needs to be ruled out. Colonic polyp, ulcer needs to be ruled out.

## 2017-01-07 ENCOUNTER — Encounter (INDEPENDENT_AMBULATORY_CARE_PROVIDER_SITE_OTHER): Payer: Self-pay

## 2017-01-10 ENCOUNTER — Encounter (INDEPENDENT_AMBULATORY_CARE_PROVIDER_SITE_OTHER): Payer: Self-pay

## 2017-02-01 DIAGNOSIS — Z6828 Body mass index (BMI) 28.0-28.9, adult: Secondary | ICD-10-CM | POA: Diagnosis not present

## 2017-02-01 DIAGNOSIS — L03011 Cellulitis of right finger: Secondary | ICD-10-CM | POA: Diagnosis not present

## 2017-02-11 DIAGNOSIS — E782 Mixed hyperlipidemia: Secondary | ICD-10-CM | POA: Diagnosis not present

## 2017-02-11 DIAGNOSIS — Z6827 Body mass index (BMI) 27.0-27.9, adult: Secondary | ICD-10-CM | POA: Diagnosis not present

## 2017-02-11 DIAGNOSIS — Z0000 Encounter for general adult medical examination without abnormal findings: Secondary | ICD-10-CM | POA: Diagnosis not present

## 2017-02-11 DIAGNOSIS — I10 Essential (primary) hypertension: Secondary | ICD-10-CM | POA: Diagnosis not present

## 2017-02-16 DIAGNOSIS — Z6828 Body mass index (BMI) 28.0-28.9, adult: Secondary | ICD-10-CM | POA: Diagnosis not present

## 2017-02-16 DIAGNOSIS — L03011 Cellulitis of right finger: Secondary | ICD-10-CM | POA: Diagnosis not present

## 2017-03-02 DIAGNOSIS — B3781 Candidal esophagitis: Secondary | ICD-10-CM | POA: Diagnosis not present

## 2017-03-02 DIAGNOSIS — Z6828 Body mass index (BMI) 28.0-28.9, adult: Secondary | ICD-10-CM | POA: Diagnosis not present

## 2017-03-10 DIAGNOSIS — M85852 Other specified disorders of bone density and structure, left thigh: Secondary | ICD-10-CM | POA: Diagnosis not present

## 2017-03-28 DIAGNOSIS — I10 Essential (primary) hypertension: Secondary | ICD-10-CM | POA: Diagnosis not present

## 2017-03-28 DIAGNOSIS — N189 Chronic kidney disease, unspecified: Secondary | ICD-10-CM | POA: Diagnosis not present

## 2017-03-28 DIAGNOSIS — E7800 Pure hypercholesterolemia, unspecified: Secondary | ICD-10-CM | POA: Diagnosis not present

## 2017-03-28 DIAGNOSIS — E782 Mixed hyperlipidemia: Secondary | ICD-10-CM | POA: Diagnosis not present

## 2017-03-28 DIAGNOSIS — F331 Major depressive disorder, recurrent, moderate: Secondary | ICD-10-CM | POA: Diagnosis not present

## 2017-03-28 DIAGNOSIS — M1380 Other specified arthritis, unspecified site: Secondary | ICD-10-CM | POA: Diagnosis not present

## 2017-04-01 DIAGNOSIS — I10 Essential (primary) hypertension: Secondary | ICD-10-CM | POA: Diagnosis not present

## 2017-04-01 DIAGNOSIS — R5383 Other fatigue: Secondary | ICD-10-CM | POA: Diagnosis not present

## 2017-04-01 DIAGNOSIS — J301 Allergic rhinitis due to pollen: Secondary | ICD-10-CM | POA: Diagnosis not present

## 2017-04-01 DIAGNOSIS — Z6828 Body mass index (BMI) 28.0-28.9, adult: Secondary | ICD-10-CM | POA: Diagnosis not present

## 2017-04-01 DIAGNOSIS — N189 Chronic kidney disease, unspecified: Secondary | ICD-10-CM | POA: Diagnosis not present

## 2017-04-01 DIAGNOSIS — Z1212 Encounter for screening for malignant neoplasm of rectum: Secondary | ICD-10-CM | POA: Diagnosis not present

## 2017-04-01 DIAGNOSIS — E782 Mixed hyperlipidemia: Secondary | ICD-10-CM | POA: Diagnosis not present

## 2017-04-01 DIAGNOSIS — Z23 Encounter for immunization: Secondary | ICD-10-CM | POA: Diagnosis not present

## 2017-04-06 ENCOUNTER — Encounter (HOSPITAL_COMMUNITY): Payer: Self-pay | Admitting: *Deleted

## 2017-04-06 ENCOUNTER — Encounter (HOSPITAL_COMMUNITY)
Admission: RE | Disposition: A | Discharge status: Home / Self Care | Payer: Self-pay | Source: Ambulatory Visit | Attending: Internal Medicine

## 2017-04-06 ENCOUNTER — Ambulatory Visit (HOSPITAL_COMMUNITY)
Admission: RE | Admit: 2017-04-06 | Discharge: 2017-04-06 | Disposition: A | Discharge status: Home / Self Care | Payer: Medicare Other | Source: Ambulatory Visit | Attending: Internal Medicine | Admitting: Internal Medicine

## 2017-04-06 DIAGNOSIS — R195 Other fecal abnormalities: Secondary | ICD-10-CM | POA: Diagnosis not present

## 2017-04-06 DIAGNOSIS — K644 Residual hemorrhoidal skin tags: Secondary | ICD-10-CM

## 2017-04-06 DIAGNOSIS — I10 Essential (primary) hypertension: Secondary | ICD-10-CM | POA: Insufficient documentation

## 2017-04-06 DIAGNOSIS — K6289 Other specified diseases of anus and rectum: Secondary | ICD-10-CM | POA: Diagnosis not present

## 2017-04-06 DIAGNOSIS — Z7982 Long term (current) use of aspirin: Secondary | ICD-10-CM | POA: Insufficient documentation

## 2017-04-06 DIAGNOSIS — Z79899 Other long term (current) drug therapy: Secondary | ICD-10-CM | POA: Diagnosis not present

## 2017-04-06 DIAGNOSIS — K921 Melena: Secondary | ICD-10-CM | POA: Diagnosis not present

## 2017-04-06 DIAGNOSIS — E7800 Pure hypercholesterolemia, unspecified: Secondary | ICD-10-CM | POA: Diagnosis not present

## 2017-04-06 DIAGNOSIS — F329 Major depressive disorder, single episode, unspecified: Secondary | ICD-10-CM | POA: Insufficient documentation

## 2017-04-06 HISTORY — DX: Depression, unspecified: F32.A

## 2017-04-06 HISTORY — DX: Other seasonal allergic rhinitis: J30.2

## 2017-04-06 HISTORY — DX: Major depressive disorder, single episode, unspecified: F32.9

## 2017-04-06 HISTORY — DX: Pure hypercholesterolemia, unspecified: E78.00

## 2017-04-06 HISTORY — PX: COLONOSCOPY: SHX5424

## 2017-04-06 SURGERY — COLONOSCOPY
Anesthesia: Moderate Sedation

## 2017-04-06 MED ORDER — MEPERIDINE HCL 50 MG/ML IJ SOLN
INTRAMUSCULAR | Status: DC | PRN
Start: 2017-04-06 — End: 2017-04-06
  Administered 2017-04-06 (×2): 25 mg via INTRAVENOUS

## 2017-04-06 MED ORDER — SODIUM CHLORIDE 0.9 % IV SOLN
INTRAVENOUS | Status: DC
  Administered 2017-04-06: 11:00:00 via INTRAVENOUS

## 2017-04-06 MED ORDER — MIDAZOLAM HCL 5 MG/5ML IJ SOLN
INTRAMUSCULAR | Status: AC
  Filled 2017-04-06: qty 10

## 2017-04-06 MED ORDER — MEPERIDINE HCL 50 MG/ML IJ SOLN
INTRAMUSCULAR | Status: AC
  Filled 2017-04-06: qty 1

## 2017-04-06 MED ORDER — SIMETHICONE 40 MG/0.6ML PO SUSP
ORAL | Status: DC | PRN
  Administered 2017-04-06: 12:00:00

## 2017-04-06 MED ORDER — MIDAZOLAM HCL 5 MG/5ML IJ SOLN
INTRAMUSCULAR | Status: DC | PRN
  Administered 2017-04-06 (×2): 2 mg via INTRAVENOUS

## 2017-04-06 NOTE — H&P (Signed)
Becky Galvan is an 71 y.o. female.   Chief Complaint: patient is here for colonoscopy. HPI: patient is 71 year old Caucasian female who was recently found to have heme-positive stool is therefore here for diagnostic colonoscopy. She denies melena or rectal bleeding. She has history of IBS is able to control with her diet and fiber supplement.  Last colonoscopy was normal in August 2010. Family history is negative for CRC.  Past Medical History:  Diagnosis Date  . Depression   . Guaiac + stool 01/06/2017  . Hypercholesteremia   . Hypertension   . Seasonal allergies     Past Surgical History:  Procedure Laterality Date  . LAPAROSCOPY    . right knee arthroscopy      Family History  Problem Relation Age of Onset  . Colon cancer Neg Hx    Social History:  reports that she has never smoked. She has never used smokeless tobacco. She reports that she does not drink alcohol or use drugs.  Allergies:  Allergies  Allergen Reactions  . Lisinopril Hives  . Sulfa Antibiotics Hives    Medications Prior to Admission  Medication Sig Dispense Refill  . aspirin 81 MG chewable tablet Chew 81 mg by mouth daily.    . Calcium-Vitamin D (CALTRATE 600 PLUS-VIT D PO) Take 1 capsule by mouth daily.     . Carboxymethylcellulose Sodium (THERATEARS OP) Place 1 drop into both eyes 2 (two) times daily as needed (dry eyes).    . hydrocortisone 2.5 % cream Apply 1 application topically 2 (two) times daily as needed.    . Methylcellulose, Laxative, (CITRUCEL PO) Take 1 capsule by mouth at bedtime.     . metoprolol tartrate (LOPRESSOR) 25 MG tablet Take 25 mg by mouth 2 (two) times daily.     . Misc Natural Products (OSTEO BI-FLEX ADV JOINT SHIELD PO) Take 2 tablets by mouth daily.     . Multiple Vitamin (MULTI-DAY PO) Take 1 tablet by mouth daily.     Marland Kitchen nystatin cream (MYCOSTATIN) Apply 1 application topically 2 (two) times daily.    . Omega-3 Fatty Acids (FISH OIL) 1000 MG CAPS Take 1 capsule by mouth  daily.     . sertraline (ZOLOFT) 50 MG tablet Take 50 mg by mouth daily.    . simvastatin (ZOCOR) 40 MG tablet Take 40 mg by mouth every evening.     . sodium chloride (OCEAN) 0.65 % SOLN nasal spray Place 1 spray into both nostrils 2 (two) times daily as needed for congestion.    . vitamin C (ASCORBIC ACID) 500 MG tablet Take 500 mg by mouth daily.      No results found for this or any previous visit (from the past 48 hour(s)). No results found.  ROS  Blood pressure 138/73, pulse 65, temperature (!) 97.4 F (36.3 C), temperature source Oral, resp. rate (!) 22, height 5\' 6"  (1.676 m), weight 172 lb (78 kg), SpO2 99 %. Physical Exam  Constitutional: She appears well-developed and well-nourished.  HENT:  Mouth/Throat: Oropharynx is clear and moist.  Eyes: Conjunctivae are normal. No scleral icterus.  Neck: No thyromegaly present.  Cardiovascular: Normal rate and regular rhythm.   No murmur heard. Respiratory: Effort normal and breath sounds normal.  GI: Soft. She exhibits no distension and no mass. There is no tenderness.  Musculoskeletal: She exhibits no edema.  Lymphadenopathy:    She has no cervical adenopathy.  Neurological: She is alert.  Skin: Skin is warm and dry.  Assessment/Plan Heme positive stool. Diagnostic colonoscopy.  Lionel DecemberNajeeb Rehman, MD 04/06/2017, 11:36 AM

## 2017-04-06 NOTE — Discharge Instructions (Signed)
Resume usual medications including aspirin as before. Resume usual diet. No driving for 24 hours. Next exam in 10 years.   Colonoscopy, Adult, Care After This sheet gives you information about how to care for yourself after your procedure. Your health care provider may also give you more specific instructions. If you have problems or questions, contact your health care provider. What can I expect after the procedure? After the procedure, it is common to have:  A small amount of blood in your stool for 24 hours after the procedure.  Some gas.  Mild abdominal cramping or bloating.  Follow these instructions at home: General instructions   For the first 24 hours after the procedure: ? Do not drive or use machinery. ? Do not sign important documents. ? Do not drink alcohol. ? Do your regular daily activities at a slower pace than normal. ? Eat soft, easy-to-digest foods. ? Rest often.  Take over-the-counter or prescription medicines only as told by your health care provider.  It is up to you to get the results of your procedure. Ask your health care provider, or the department performing the procedure, when your results will be ready. Relieving cramping and bloating  Try walking around when you have cramps or feel bloated.  Apply heat to your abdomen as told by your health care provider. Use a heat source that your health care provider recommends, such as a moist heat pack or a heating pad. ? Place a towel between your skin and the heat source. ? Leave the heat on for 20-30 minutes. ? Remove the heat if your skin turns bright red. This is especially important if you are unable to feel pain, heat, or cold. You may have a greater risk of getting burned. Eating and drinking  Drink enough fluid to keep your urine clear or pale yellow.  Resume your normal diet as instructed by your health care provider. Avoid heavy or fried foods that are hard to digest.  Avoid drinking alcohol  for as long as instructed by your health care provider. Contact a health care provider if:  You have blood in your stool 2-3 days after the procedure. Get help right away if:  You have more than a small spotting of blood in your stool.  You pass large blood clots in your stool.  Your abdomen is swollen.  You have nausea or vomiting.  You have a fever.  You have increasing abdominal pain that is not relieved with medicine. This information is not intended to replace advice given to you by your health care provider. Make sure you discuss any questions you have with your health care provider. Document Released: 01/20/2004 Document Revised: 03/01/2016 Document Reviewed: 08/19/2015 Elsevier Interactive Patient Education  [employer]2018 [employer]

## 2017-04-06 NOTE — Op Note (Signed)
[facility] Patient Name: Becky Galvan Procedure Date: 04/06/2017 11:21 AM MRN: [id number] Date of Birth: 10-17-1945 Attending MD: Lionel December , MD CSN: [id number] Age: 71 Admit Type: Outpatient Procedure:                Colonoscopy Indications:              Heme positive stool Providers:                Lionel December, MD, Loma Messing B. Patsy Lager, RN, Burke Keels, Technician Referring MD:             Roxine Caddy, PA-C Medicines:                Meperidine 50 mg IV, Midazolam 4 mg IV Complications:            No immediate complications. Estimated Blood Loss:     Estimated blood loss: none. Procedure:                Pre-Anesthesia Assessment:                           - Prior to the procedure, a History and Physical                            was performed, and patient medications and                            allergies were reviewed. The patient's tolerance of                            previous anesthesia was also reviewed. The risks                            and benefits of the procedure and the sedation                            options and risks were discussed with the patient.                            All questions were answered, and informed consent                            was obtained. Prior Anticoagulants: The patient                            last took aspirin 3 days prior to the procedure.                            ASA Grade Assessment: II - A patient with mild                            systemic disease. After reviewing the risks and  benefits, the patient was deemed in satisfactory                            condition to undergo the procedure.                           After obtaining informed consent, the colonoscope                            was passed under direct vision. Throughout the                            procedure, the patient's blood pressure, pulse, and                            oxygen saturations  were monitored continuously. The                            EC-[id number] ([id number]) scope was introduced through                            the anus and advanced to the the terminal ileum,                            with identification of the appendiceal orifice and                            IC valve. The colonoscopy was performed without                            difficulty. The patient tolerated the procedure                            well. The quality of the bowel preparation was                            excellent. The terminal ileum, ileocecal valve,                            appendiceal orifice, and rectum were photographed. Scope In: 11:45:55 AM Scope Out: 12:03:32 PM Scope Withdrawal Time: 0 hours 10 minutes 2 seconds  Total Procedure Duration: 0 hours 17 minutes 37 seconds  Findings:      The perianal and digital rectal examinations were normal.      The terminal ileum appeared normal.      The colon (entire examined portion) appeared normal.      External hemorrhoids were found during retroflexion. The hemorrhoids       were small.      Anal papilla(e) were hypertrophied. Impression:               - The examined portion of the ileum was normal.                           - The entire examined colon is normal.                           -  External hemorrhoids.                           - Anal papilla(e) were hypertrophied.                           - No specimens collected. Moderate Sedation:      Moderate (conscious) sedation was administered by the endoscopy nurse       and supervised by the endoscopist. The following parameters were       monitored: oxygen saturation, heart rate, blood pressure, CO2       capnography and response to care. Total physician intraservice time was       18 minutes. Recommendation:           - Patient has a contact number available for                            emergencies. The signs and symptoms of potential                            delayed  complications were discussed with the                            patient. Return to normal activities tomorrow.                            Written discharge instructions were provided to the                            patient.                           - Resume previous diet today.                           - Continue present medications.                           - Resume aspirin at prior dose today.                           - Repeat colonoscopy in 10 years for screening                            purposes. Procedure Code(s):        --- Professional ---                           [phone], Colonoscopy, flexible; diagnostic, including                            collection of specimen(s) by brushing or washing,                            when performed (separate procedure)  16109, Moderate sedation services provided by the                            same physician or other qualified health care                            professional performing the diagnostic or                            therapeutic service that the sedation supports,                            requiring the presence of an independent trained                            observer to assist in the monitoring of the                            patient's level of consciousness and physiological                            status; initial 15 minutes of intraservice time,                            patient age 62 years or older Diagnosis Code(s):        --- Professional ---                           K64.4, Residual hemorrhoidal skin tags                           K62.89, Other specified diseases of anus and rectum                           R19.5, Other fecal abnormalities CPT copyright 2016 American [employer] Association. All rights reserved. The codes documented in this report are preliminary and upon coder review may  be revised to meet current compliance requirements. Lionel December, MD Lionel December,  MD 04/06/2017 12:14:42 PM This report has been signed electronically. Number of Addenda: 0

## 2017-04-11 ENCOUNTER — Encounter (HOSPITAL_COMMUNITY): Payer: Self-pay | Admitting: Internal Medicine

## 2017-04-25 ENCOUNTER — Other Ambulatory Visit: Payer: Self-pay | Admitting: General Practice

## 2017-04-25 DIAGNOSIS — Z139 Encounter for screening, unspecified: Secondary | ICD-10-CM

## 2017-05-02 ENCOUNTER — Ambulatory Visit (INDEPENDENT_AMBULATORY_CARE_PROVIDER_SITE_OTHER): Payer: Medicare Other | Admitting: Orthopaedic Surgery

## 2017-05-02 ENCOUNTER — Ambulatory Visit (INDEPENDENT_AMBULATORY_CARE_PROVIDER_SITE_OTHER): Payer: Self-pay

## 2017-05-02 ENCOUNTER — Encounter (INDEPENDENT_AMBULATORY_CARE_PROVIDER_SITE_OTHER): Payer: Self-pay | Admitting: Orthopaedic Surgery

## 2017-05-02 ENCOUNTER — Ambulatory Visit (INDEPENDENT_AMBULATORY_CARE_PROVIDER_SITE_OTHER): Payer: Medicare Other

## 2017-05-02 VITALS — BP 139/69 | HR 73 | Resp 12 | Ht 64.0 in | Wt 165.0 lb

## 2017-05-02 DIAGNOSIS — M79642 Pain in left hand: Secondary | ICD-10-CM | POA: Diagnosis not present

## 2017-05-02 DIAGNOSIS — M79641 Pain in right hand: Secondary | ICD-10-CM

## 2017-05-02 NOTE — Progress Notes (Signed)
Office Visit Note   Patient: Becky Galvan           Date of Birth: 11/13/1945           MRN: [id number] Visit Date: 05/02/2017              Requested by: Richardean Chimeraaniel, Terry G, MD [address] [place], [place] [id number] PCP: Richardean Chimeraaniel, Terry G, MD   Assessment & Plan: Visit Diagnoses:  1. 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 Motion about using heat, over-the-counter anti-inflammatory medicines. Return as needed  Follow-Up Instructions: Return if symptoms worsen or fail to improve.   Orders:  Orders Placed This Encounter  Procedures  . XR Hand Complete Right  . XR Hand Complete Left   No orders of the defined types were placed in this encounter.     Procedures: No procedures performed   Clinical Data: No additional findings.   Subjective: Chief Complaint  Patient presents with  . Right Hand - Pain, Edema, Weakness    Becky Galvan is a 71 y o here for bilateral hand pain. Swelling and pain, no numbness. No fever or chills. She used Voltaren get then put on gloves to work outside and she developed a blistering on several fingers.   . Left Hand - Pain, Edema, Weakness    Her hands have been hurting 1 month but getting worse. PCP gave you antibiotic and fungal treatment 7 days. Pain left and now pain has returned. She was taking window hardware down and hands were very painful.  Family history of arthritis in both hands particularly on her father's side. Based on her history it appears that she had an infection which could be related to the paronychia. Much better with antibiotics. Symptoms presently are consistent with achiness and soreness about the DIP joints both hands. No numbness no tingling. Prior bilateral carpal tunnel releases without problems  HPI  Review of Systems  Constitutional: Negative for chills, fatigue and fever.  Eyes: Negative for itching.  Respiratory: Negative for chest tightness and shortness of breath.    Cardiovascular: Negative for chest pain, palpitations and leg swelling.  Gastrointestinal: Negative for blood in stool, constipation and diarrhea.  Endocrine: Negative for polyuria.  Genitourinary: Negative for dysuria.  Musculoskeletal: Negative for back pain, joint swelling, neck pain and neck stiffness.  Allergic/Immunologic: Negative for immunocompromised state.  Neurological: Negative for dizziness and numbness.  Hematological: Does not bruise/bleed easily.  Psychiatric/Behavioral: The patient is not nervous/anxious.      Objective: Vital Signs: BP 139/69   Pulse 73   Resp 12   Ht 5\' 4"  (1.626 m)   Wt 165 lb (74.8 kg)   BMI 28.32 kg/m   Physical Exam  Ortho Exam awake alert and oriented 3. Comfortable sitting. Old carpal tunnel incisions of healed without problem. No Tinel's or Phalen's about either wrist. Full fist and release. No pain about either basilar thumb joints. Skin intact. Hypertrophic changes diffusely about the DIP joints of both hands with some deformities consistent with advanced arthritis. He joints and MP joints are clear good capillary refill to fingers. No evidence of active infection  Specialty Comments:  No specialty comments available.  Imaging: Xr Hand Complete Left  Result Date: 05/02/2017 Films of the left hand were obtained in several projections there is some mild degenerative changes the base of the thumb and at the navicular greater multangular joint. No subluxation. Significant erosive changes consistent  with arthritis at the DIP joints of the index and long fingers. Some arthritic change without erosion of the IP joint of the thumb carpophalangeal joints appear to be intact above consistent with osteoarthritis  Xr Hand Complete Right  Result Date: 05/02/2017 Films of the right hand were obtained in several projections there considerable erosive changes at the DIP joint of the ring finger under changes without erosion at the IP joint of the  thumb and DIP joint of the index finger. Mild arthritis of the DIP joint of the long finger. Metacarpal phalangeal joints appear to be intact although there is some mild cystic changes in the metacarpal heads. Joint spaces are well maintained no evidence of a fracture.    PMFS History: Patient Active Problem List   Diagnosis Date Noted  . Guaiac + stool 01/06/2017  . Unilateral primary osteoarthritis, right knee 06/02/2016   Past Medical History:  Diagnosis Date  . Depression   . Guaiac + stool 01/06/2017  . Hypercholesteremia   . Hypertension   . Seasonal allergies     Family History  Problem Relation Age of Onset  . Colon cancer Neg Hx     Past Surgical History:  Procedure Laterality Date  . LAPAROSCOPY    . right knee arthroscopy     Social History   Occupational History  . Not on file  Tobacco Use  . Smoking status: Never Smoker  . Smokeless tobacco: Never Used  Substance and Sexual Activity  . Alcohol use: No  . Drug use: No  . Sexual activity: Not on file

## 2017-06-03 ENCOUNTER — Ambulatory Visit
Admission: RE | Admit: 2017-06-03 | Discharge: 2017-06-03 | Disposition: A | Discharge status: Home / Self Care | Payer: Medicare Other | Source: Ambulatory Visit | Attending: General Practice | Admitting: General Practice

## 2017-06-03 DIAGNOSIS — Z139 Encounter for screening, unspecified: Secondary | ICD-10-CM

## 2017-06-03 DIAGNOSIS — Z1231 Encounter for screening mammogram for malignant neoplasm of breast: Secondary | ICD-10-CM | POA: Diagnosis not present

## 2017-07-22 DIAGNOSIS — F331 Major depressive disorder, recurrent, moderate: Secondary | ICD-10-CM | POA: Diagnosis not present

## 2017-07-22 DIAGNOSIS — E7800 Pure hypercholesterolemia, unspecified: Secondary | ICD-10-CM | POA: Diagnosis not present

## 2017-07-22 DIAGNOSIS — I10 Essential (primary) hypertension: Secondary | ICD-10-CM | POA: Diagnosis not present

## 2017-07-22 DIAGNOSIS — E782 Mixed hyperlipidemia: Secondary | ICD-10-CM | POA: Diagnosis not present

## 2017-07-22 DIAGNOSIS — N189 Chronic kidney disease, unspecified: Secondary | ICD-10-CM | POA: Diagnosis not present

## 2017-07-29 DIAGNOSIS — R5383 Other fatigue: Secondary | ICD-10-CM | POA: Diagnosis not present

## 2017-07-29 DIAGNOSIS — I10 Essential (primary) hypertension: Secondary | ICD-10-CM | POA: Diagnosis not present

## 2017-07-29 DIAGNOSIS — Z6829 Body mass index (BMI) 29.0-29.9, adult: Secondary | ICD-10-CM | POA: Diagnosis not present

## 2017-07-29 DIAGNOSIS — N189 Chronic kidney disease, unspecified: Secondary | ICD-10-CM | POA: Diagnosis not present

## 2017-07-29 DIAGNOSIS — J301 Allergic rhinitis due to pollen: Secondary | ICD-10-CM | POA: Diagnosis not present

## 2017-07-29 DIAGNOSIS — E782 Mixed hyperlipidemia: Secondary | ICD-10-CM | POA: Diagnosis not present

## 2017-08-30 DIAGNOSIS — J00 Acute nasopharyngitis [common cold]: Secondary | ICD-10-CM | POA: Diagnosis not present

## 2017-08-30 DIAGNOSIS — Z6829 Body mass index (BMI) 29.0-29.9, adult: Secondary | ICD-10-CM | POA: Diagnosis not present

## 2017-09-23 DIAGNOSIS — J309 Allergic rhinitis, unspecified: Secondary | ICD-10-CM | POA: Diagnosis not present

## 2017-09-23 DIAGNOSIS — J029 Acute pharyngitis, unspecified: Secondary | ICD-10-CM | POA: Diagnosis not present

## 2017-09-23 DIAGNOSIS — Z6829 Body mass index (BMI) 29.0-29.9, adult: Secondary | ICD-10-CM | POA: Diagnosis not present

## 2017-10-18 ENCOUNTER — Telehealth (INDEPENDENT_AMBULATORY_CARE_PROVIDER_SITE_OTHER): Payer: Self-pay | Admitting: Orthopaedic Surgery

## 2017-10-18 NOTE — Telephone Encounter (Signed)
Please advise 

## 2017-10-18 NOTE — Telephone Encounter (Signed)
Patient called requesting prescription refill of Diclofenac Sodium.  Patient's pharmacy is  [facility] in [place].

## 2017-10-19 NOTE — Telephone Encounter (Signed)
Gel or pill?

## 2017-10-20 NOTE — Telephone Encounter (Signed)
Spoke with pahrmacist in [place] and they said it was pills

## 2017-10-21 ENCOUNTER — Other Ambulatory Visit (INDEPENDENT_AMBULATORY_CARE_PROVIDER_SITE_OTHER): Payer: Self-pay | Admitting: Radiology

## 2017-10-21 MED ORDER — DICLOFENAC SODIUM 75 MG PO TBEC: DELAYED_RELEASE_TABLET | ORAL | 0 refills | Status: AC

## 2017-10-21 NOTE — Telephone Encounter (Signed)
Voltaren  #30 1 tab po qd

## 2017-10-21 NOTE — Telephone Encounter (Signed)
Sent rx to [facility] in [place]

## 2017-12-02 DIAGNOSIS — E782 Mixed hyperlipidemia: Secondary | ICD-10-CM | POA: Diagnosis not present

## 2017-12-02 DIAGNOSIS — I10 Essential (primary) hypertension: Secondary | ICD-10-CM | POA: Diagnosis not present

## 2017-12-02 DIAGNOSIS — N189 Chronic kidney disease, unspecified: Secondary | ICD-10-CM | POA: Diagnosis not present

## 2017-12-02 DIAGNOSIS — E7800 Pure hypercholesterolemia, unspecified: Secondary | ICD-10-CM | POA: Diagnosis not present

## 2017-12-02 DIAGNOSIS — R5383 Other fatigue: Secondary | ICD-10-CM | POA: Diagnosis not present

## 2017-12-09 DIAGNOSIS — N189 Chronic kidney disease, unspecified: Secondary | ICD-10-CM | POA: Diagnosis not present

## 2017-12-09 DIAGNOSIS — R5383 Other fatigue: Secondary | ICD-10-CM | POA: Diagnosis not present

## 2017-12-09 DIAGNOSIS — I10 Essential (primary) hypertension: Secondary | ICD-10-CM | POA: Diagnosis not present

## 2017-12-09 DIAGNOSIS — J301 Allergic rhinitis due to pollen: Secondary | ICD-10-CM | POA: Diagnosis not present

## 2017-12-09 DIAGNOSIS — Z6828 Body mass index (BMI) 28.0-28.9, adult: Secondary | ICD-10-CM | POA: Diagnosis not present

## 2017-12-09 DIAGNOSIS — E782 Mixed hyperlipidemia: Secondary | ICD-10-CM | POA: Diagnosis not present

## 2017-12-16 DIAGNOSIS — H2513 Age-related nuclear cataract, bilateral: Secondary | ICD-10-CM | POA: Diagnosis not present

## 2018-02-10 DIAGNOSIS — I788 Other diseases of capillaries: Secondary | ICD-10-CM | POA: Diagnosis not present

## 2018-03-31 DIAGNOSIS — R5383 Other fatigue: Secondary | ICD-10-CM | POA: Diagnosis not present

## 2018-03-31 DIAGNOSIS — E782 Mixed hyperlipidemia: Secondary | ICD-10-CM | POA: Diagnosis not present

## 2018-03-31 DIAGNOSIS — I10 Essential (primary) hypertension: Secondary | ICD-10-CM | POA: Diagnosis not present

## 2018-03-31 DIAGNOSIS — N189 Chronic kidney disease, unspecified: Secondary | ICD-10-CM | POA: Diagnosis not present

## 2018-03-31 DIAGNOSIS — Z23 Encounter for immunization: Secondary | ICD-10-CM | POA: Diagnosis not present

## 2018-03-31 DIAGNOSIS — E7800 Pure hypercholesterolemia, unspecified: Secondary | ICD-10-CM | POA: Diagnosis not present

## 2018-04-07 DIAGNOSIS — M25561 Pain in right knee: Secondary | ICD-10-CM | POA: Diagnosis not present

## 2018-04-07 DIAGNOSIS — Z0000 Encounter for general adult medical examination without abnormal findings: Secondary | ICD-10-CM | POA: Diagnosis not present

## 2018-04-07 DIAGNOSIS — Z6829 Body mass index (BMI) 29.0-29.9, adult: Secondary | ICD-10-CM | POA: Diagnosis not present

## 2018-04-07 DIAGNOSIS — R300 Dysuria: Secondary | ICD-10-CM | POA: Diagnosis not present

## 2018-04-07 DIAGNOSIS — R0602 Shortness of breath: Secondary | ICD-10-CM | POA: Diagnosis not present

## 2018-04-13 ENCOUNTER — Ambulatory Visit (INDEPENDENT_AMBULATORY_CARE_PROVIDER_SITE_OTHER): Payer: Medicare Other | Admitting: Orthopaedic Surgery

## 2018-04-14 DIAGNOSIS — R0602 Shortness of breath: Secondary | ICD-10-CM | POA: Diagnosis not present

## 2018-04-28 ENCOUNTER — Ambulatory Visit (INDEPENDENT_AMBULATORY_CARE_PROVIDER_SITE_OTHER): Payer: Medicare Other | Admitting: Orthopaedic Surgery

## 2018-04-28 ENCOUNTER — Encounter (INDEPENDENT_AMBULATORY_CARE_PROVIDER_SITE_OTHER): Payer: Self-pay | Admitting: Orthopaedic Surgery

## 2018-04-28 VITALS — BP 156/76 | HR 69 | Ht 64.0 in | Wt 165.0 lb

## 2018-04-28 DIAGNOSIS — M1711 Unilateral primary osteoarthritis, right knee: Secondary | ICD-10-CM

## 2018-04-28 MED ORDER — LIDOCAINE HCL 1 % IJ SOLN
2.0000 mL | INTRAMUSCULAR | Status: AC | PRN
  Administered 2018-04-28: 2 mL

## 2018-04-28 MED ORDER — METHYLPREDNISOLONE ACETATE 40 MG/ML IJ SUSP
80.0000 mg | INTRAMUSCULAR | Status: AC | PRN
  Administered 2018-04-28: 80 mg

## 2018-04-28 MED ORDER — BUPIVACAINE HCL 0.5 % IJ SOLN
2.0000 mL | INTRAMUSCULAR | Status: AC | PRN
  Administered 2018-04-28: 2 mL via INTRA_ARTICULAR

## 2018-04-28 NOTE — Progress Notes (Signed)
Office Visit Note   Patient: Becky Galvan           Date of Birth: [birth year]           MRN: [id number] Visit Date: 04/28/2018              Requested by: Richardean Chimera, MD [address] [place], [place] [address] PCP: Richardean Chimera, MD   Assessment & Plan: Visit Diagnoses:  1. Unilateral primary osteoarthritis, right knee     Plan: Primary osteoarthritis right knee with exacerbation of symptoms.  Has done very well in the past with cortisone.  Will inject right knee with cortisone and pre-CERT Visco supplementation continue with exercises.  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  Does have some episodic pain but remains quite active.  No specific treatment other than comfortable shoes  Follow-Up Instructions: Return if symptoms worsen or fail to improve.   Orders:  Orders Placed This Encounter  Procedures  . Large Joint Inj: R knee   No orders of the defined types were placed in this encounter.     Procedures: Large Joint Inj: R knee on 04/28/2018 9:09 AM Indications: pain and diagnostic evaluation Details: 25 G 1.5 in needle, anteromedial approach  Arthrogram: No  Medications: 2 mL lidocaine 1 %; 2 mL bupivacaine 0.5 %; 80 mg methylPREDNISolone acetate 40 MG/ML Procedure, treatment alternatives, risks and benefits explained, specific risks discussed. Consent was given by the patient. Immediately prior to procedure a time out was called to verify the correct patient, procedure, equipment, support staff and site/side marked as required. Patient was prepped and draped in the usual sterile fashion.       Clinical Data: No additional findings.   Subjective: Chief Complaint  Patient presents with  . Follow-up    RECURRENT SLIPPED AND FELL IN BATH TUBE 2.5 MO AGO AN DLANDED ON LEFT HIP, WENT TO PCP AND HAD XRAYS DONE AND WAS TOLD HIP NO HIP FX. R ANKLE PAIN FROM PRIOR INJURY,AND R KNEE PAIN HAD INJECTION AT LEAST 1 YR  AGO AND SCOPE ABOUT 8 YRS AGO  Becky Galvan relates that she has had some recurrence of her right knee arthritic pain.  She is has evidence of bilateral knee arthritis with successful cortisone in the past.  Had cortisone injection in her right knee about 2 years ago and is done very well only to have some weeks occurrence after slipping in the bathtub several months ago.  Also has evidence of osteochondral lesion of the talar dome of her right ankle with peroneal tendinopathy with occasional pain.  She wears good comfortable shoes and exercises 4 days a week.  HPI  Review of Systems  Constitutional: Negative for fatigue.  HENT: Negative for ear pain.   Eyes: Negative for pain.  Respiratory: Negative for cough and shortness of breath.   Cardiovascular: Negative for leg swelling.  Gastrointestinal: Negative for constipation and diarrhea.  Genitourinary: Negative for difficulty urinating.  Musculoskeletal: Negative for back pain.  Skin: Negative for rash.  Allergic/Immunologic: Negative for food allergies.  Neurological: Positive for weakness. Negative for numbness.  Hematological: Bruises/bleeds easily.  Psychiatric/Behavioral: Negative for sleep disturbance.     Objective: Vital Signs: BP (!) 156/76 (BP Location: Left Arm, Patient Position: Sitting, Cuff Size: Normal)   Pulse 69   Ht 5\' 4"  (1.626 m)   Wt 165 lb (74.8 kg)   BMI 28.32 kg/m   Physical Exam  Constitutional: She is oriented to person, place, and time. She appears well-developed and well-nourished.  HENT:  Mouth/Throat: Oropharynx is clear and moist.  Eyes: Pupils are equal, round, and reactive to light. EOM are normal.  Pulmonary/Chest: Effort normal.  Neurological: She is alert and oriented to person, place, and time.  Skin: Skin is warm and dry.  Psychiatric: She has a normal mood and affect. Her behavior is normal.    Ortho Exam awake alert and oriented x3.  Comfortable sitting.  Right knee without increased  heat.  No effusion.  Slight increased varus with predominant medial joint pain.  Minimal patellar crepitation.  No instability.  Flexed over 100 degrees.  No calf pain.  Some discomfort along the anterior lateral aspect of her ankle but no swelling motor exam intact.  Walks without a limp  Specialty Comments:  No specialty comments available.  Imaging: No results found.   PMFS History: Patient Active Problem List   Diagnosis Date Noted  . Guaiac + stool 01/06/2017  . Unilateral primary osteoarthritis, right knee 06/02/2016   Past Medical History:  Diagnosis Date  . Depression   . Guaiac + stool 01/06/2017  . Hypercholesteremia   . Hypertension   . Seasonal allergies     Family History  Problem Relation Age of Onset  . Breast cancer Mother 14  . Breast cancer Cousin   . Breast cancer Cousin   . Breast cancer Cousin   . Colon cancer Neg Hx     Past Surgical History:  Procedure Laterality Date  . BREAST EXCISIONAL BIOPSY Right   . COLONOSCOPY N/A 04/06/2017   Procedure: COLONOSCOPY;  Surgeon: Malissa Hippo, MD;  Location: [facility];  Service: Endoscopy;  Laterality: N/A;  125  . LAPAROSCOPY    . right knee arthroscopy     Social History   Occupational History  . Not on file  Tobacco Use  . Smoking status: Never Smoker  . Smokeless tobacco: Never Used  Substance and Sexual Activity  . Alcohol use: No  . Drug use: No  . Sexual activity: Not on file

## 2018-05-05 ENCOUNTER — Other Ambulatory Visit: Payer: Self-pay | Admitting: General Practice

## 2018-05-05 DIAGNOSIS — Z1231 Encounter for screening mammogram for malignant neoplasm of breast: Secondary | ICD-10-CM

## 2018-05-10 ENCOUNTER — Telehealth (INDEPENDENT_AMBULATORY_CARE_PROVIDER_SITE_OTHER): Payer: Self-pay

## 2018-05-10 NOTE — Telephone Encounter (Signed)
Submitted VOB for Orthovisc series, right knee. 

## 2018-05-15 ENCOUNTER — Telehealth (INDEPENDENT_AMBULATORY_CARE_PROVIDER_SITE_OTHER): Payer: Self-pay

## 2018-05-15 NOTE — Telephone Encounter (Signed)
Please schedule patient an appointment with Dr. Cleophas DunkerWhitfield for gel injection.  Thank You.  Patient is approved for Orthovisc series, right knee. Buy & Bill Covered at 100% through her insurance. No Co-pay No PA required

## 2018-05-15 NOTE — Telephone Encounter (Signed)
LMOM for patient to call and schedule Orthovisc injections 

## 2018-05-26 ENCOUNTER — Ambulatory Visit (INDEPENDENT_AMBULATORY_CARE_PROVIDER_SITE_OTHER): Payer: Medicare Other | Admitting: Orthopaedic Surgery

## 2018-06-02 ENCOUNTER — Ambulatory Visit (INDEPENDENT_AMBULATORY_CARE_PROVIDER_SITE_OTHER): Payer: Medicare Other | Admitting: Orthopaedic Surgery

## 2018-06-23 ENCOUNTER — Ambulatory Visit
Admission: RE | Admit: 2018-06-23 | Discharge: 2018-06-23 | Disposition: A | Discharge status: Home / Self Care | Payer: Medicare Other | Source: Ambulatory Visit | Attending: General Practice | Admitting: General Practice

## 2018-06-23 DIAGNOSIS — Z1231 Encounter for screening mammogram for malignant neoplasm of breast: Secondary | ICD-10-CM | POA: Diagnosis not present

## 2018-07-11 ENCOUNTER — Ambulatory Visit (INDEPENDENT_AMBULATORY_CARE_PROVIDER_SITE_OTHER): Payer: Medicare Other | Admitting: Orthopaedic Surgery

## 2018-07-12 ENCOUNTER — Ambulatory Visit (INDEPENDENT_AMBULATORY_CARE_PROVIDER_SITE_OTHER): Payer: Medicare Other | Admitting: Orthopaedic Surgery

## 2018-07-12 ENCOUNTER — Encounter (INDEPENDENT_AMBULATORY_CARE_PROVIDER_SITE_OTHER): Payer: Self-pay | Admitting: Orthopaedic Surgery

## 2018-07-12 VITALS — BP 115/68 | HR 70 | Ht 64.0 in | Wt 165.0 lb

## 2018-07-12 DIAGNOSIS — M1711 Unilateral primary osteoarthritis, right knee: Secondary | ICD-10-CM

## 2018-07-12 MED ORDER — DICLOFENAC SODIUM 1 % TD GEL: 2.0000 g | Freq: Four times a day (QID) | TRANSDERMAL | 2 refills | Status: AC

## 2018-07-12 MED ORDER — HYALURONAN 30 MG/2ML IX SOSY
30.0000 mg | PREFILLED_SYRINGE | INTRA_ARTICULAR | Status: AC | PRN
  Administered 2018-07-12: 30 mg via INTRA_ARTICULAR

## 2018-07-12 NOTE — Progress Notes (Signed)
   Office Visit Note   Patient: Becky Galvan           Date of Birth: 07-09-1945           MRN: [id number] Visit Date: 07/12/2018              Requested by: Richardean Chimera, MD [address] [place], [place] [address] PCP: Richardean Chimera, MD   Assessment & Plan: Visit Diagnoses:  1. Unilateral primary osteoarthritis, right knee     Plan: Start Orthovisc injection right knee.  Return weekly for the next 2 weeks to complete the series  Follow-Up Instructions: Return in about 1 week (around 07/19/2018).   Orders:  Orders Placed This Encounter  Procedures  . Large Joint Inj: R knee   Meds ordered this encounter  Medications  . diclofenac sodium (VOLTAREN) 1 % GEL    Sig: Apply 2 g topically 4 (four) times daily.    Dispense:  1 Tube    Refill:  2      Procedures: Large Joint Inj: R knee on 07/12/2018 3:17 PM Indications: pain and joint swelling Details: 25 G 1.5 in needle  Arthrogram: No  Medications: 30 mg Hyaluronan 30 MG/2ML Outcome: tolerated well, no immediate complications Procedure, treatment alternatives, risks and benefits explained, specific risks discussed. Consent was given by the patient. Immediately prior to procedure a time out was called to verify the correct patient, procedure, equipment, support staff and site/side marked as required. Patient was prepped and draped in the usual sterile fashion.       Clinical Data: No additional findings.   Subjective: Chief Complaint  Patient presents with  . Right Knee - Pain, Follow-up    Orthovisc #1 Right Knee  Patient presents for Orthovisc Injection #1 in her right knee.  HPI  Review of Systems   Objective: Vital Signs: BP 115/68   Pulse 70   Ht 5\' 4"  (1.626 m)   Wt 165 lb (74.8 kg)   BMI 28.32 kg/m   Physical Exam  Ortho Exam mild medial joint pain right knee but no effusion.  Knee was not hot red warm or swollen  Specialty Comments:  No specialty comments available.  Imaging: No results  found.   PMFS History: Patient Active Problem List   Diagnosis Date Noted  . Guaiac + stool 01/06/2017  . Unilateral primary osteoarthritis, right knee 06/02/2016   Past Medical History:  Diagnosis Date  . Depression   . Guaiac + stool 01/06/2017  . Hypercholesteremia   . Hypertension   . Seasonal allergies     Family History  Problem Relation Age of Onset  . Breast cancer Mother 69  . Breast cancer Cousin   . Breast cancer Cousin   . Breast cancer Cousin   . Colon cancer Neg Hx     Past Surgical History:  Procedure Laterality Date  . BREAST EXCISIONAL BIOPSY Right   . COLONOSCOPY N/A 04/06/2017   Procedure: COLONOSCOPY;  Surgeon: Malissa Hippo, MD;  Location: [facility];  Service: Endoscopy;  Laterality: N/A;  125  . LAPAROSCOPY    . right knee arthroscopy     Social History   Occupational History  . Not on file  Tobacco Use  . Smoking status: Never Smoker  . Smokeless tobacco: Never Used  Substance and Sexual Activity  . Alcohol use: No  . Drug use: No  . Sexual activity: Not on file

## 2018-07-18 ENCOUNTER — Ambulatory Visit (INDEPENDENT_AMBULATORY_CARE_PROVIDER_SITE_OTHER): Payer: Medicare Other | Admitting: Orthopaedic Surgery

## 2018-07-19 ENCOUNTER — Encounter (INDEPENDENT_AMBULATORY_CARE_PROVIDER_SITE_OTHER): Payer: Self-pay | Admitting: Orthopaedic Surgery

## 2018-07-19 ENCOUNTER — Ambulatory Visit (INDEPENDENT_AMBULATORY_CARE_PROVIDER_SITE_OTHER): Payer: Medicare Other | Admitting: Orthopaedic Surgery

## 2018-07-19 VITALS — BP 134/76 | HR 75

## 2018-07-19 DIAGNOSIS — M1711 Unilateral primary osteoarthritis, right knee: Secondary | ICD-10-CM

## 2018-07-19 MED ORDER — HYALURONAN 30 MG/2ML IX SOSY
30.0000 mg | PREFILLED_SYRINGE | INTRA_ARTICULAR | Status: AC | PRN
  Administered 2018-07-19: 30 mg via INTRA_ARTICULAR

## 2018-07-19 NOTE — Progress Notes (Signed)
   Office Visit Note   Patient: Becky Galvan           Date of Birth: November 21, 1945           MRN: [id number] Visit Date: 07/19/2018              Requested by: Richardean Chimera, MD [address] [place], [place] [address] PCP: Richardean Chimera, MD   Assessment & Plan: Visit Diagnoses:  1. Unilateral primary osteoarthritis, right knee     Plan: Second Orthovisc injection right knee today.  Follow-up next week to complete the series  Follow-Up Instructions: Return in about 1 week (around 07/26/2018).   Orders:  No orders of the defined types were placed in this encounter.  No orders of the defined types were placed in this encounter.     Procedures: Large Joint Inj: R knee on 07/19/2018 3:16 PM Indications: pain and joint swelling Details: 25 G 1.5 in needle  Arthrogram: No  Medications: 30 mg Hyaluronan 30 MG/2ML Outcome: tolerated well, no immediate complications Procedure, treatment alternatives, risks and benefits explained, specific risks discussed. Consent was given by the patient. Immediately prior to procedure a time out was called to verify the correct patient, procedure, equipment, support staff and site/side marked as required. Patient was prepped and draped in the usual sterile fashion.       Clinical Data: No additional findings.   Subjective: Chief Complaint  Patient presents with  . Right Knee - Pain, Follow-up    Orthovisc Injection #2  First Orthovisc injection last week.  Not sure she can tell much of a difference but no problems  HPI  Review of Systems   Objective: Vital Signs: BP 134/76   Pulse 75   Physical Exam  Ortho Exam right knee was not hot red warm or swollen.  No significant pain  Specialty Comments:  No specialty comments available.  Imaging: No results found.   PMFS History: Patient Active Problem List   Diagnosis Date Noted  . Guaiac + stool 01/06/2017  . Unilateral primary osteoarthritis, right knee 06/02/2016   Past Medical  History:  Diagnosis Date  . Depression   . Guaiac + stool 01/06/2017  . Hypercholesteremia   . Hypertension   . Seasonal allergies     Family History  Problem Relation Age of Onset  . Breast cancer Mother 57  . Breast cancer Cousin   . Breast cancer Cousin   . Breast cancer Cousin   . Colon cancer Neg Hx     Past Surgical History:  Procedure Laterality Date  . BREAST EXCISIONAL BIOPSY Right   . COLONOSCOPY N/A 04/06/2017   Procedure: COLONOSCOPY;  Surgeon: Malissa Hippo, MD;  Location: [facility];  Service: Endoscopy;  Laterality: N/A;  125  . LAPAROSCOPY    . right knee arthroscopy     Social History   Occupational History  . Not on file  Tobacco Use  . Smoking status: Never Smoker  . Smokeless tobacco: Never Used  Substance and Sexual Activity  . Alcohol use: No  . Drug use: No  . Sexual activity: Not on file     Valeria Batman, MD   Note - This record has been created using [employer].  Chart creation errors have been sought, but may not always  have been located. Such creation errors do not reflect on  the standard of medical care.

## 2018-07-21 DIAGNOSIS — R5383 Other fatigue: Secondary | ICD-10-CM | POA: Diagnosis not present

## 2018-07-21 DIAGNOSIS — E782 Mixed hyperlipidemia: Secondary | ICD-10-CM | POA: Diagnosis not present

## 2018-07-21 DIAGNOSIS — E7800 Pure hypercholesterolemia, unspecified: Secondary | ICD-10-CM | POA: Diagnosis not present

## 2018-07-21 DIAGNOSIS — N189 Chronic kidney disease, unspecified: Secondary | ICD-10-CM | POA: Diagnosis not present

## 2018-07-21 DIAGNOSIS — I10 Essential (primary) hypertension: Secondary | ICD-10-CM | POA: Diagnosis not present

## 2018-07-26 ENCOUNTER — Ambulatory Visit (INDEPENDENT_AMBULATORY_CARE_PROVIDER_SITE_OTHER): Payer: Medicare Other | Admitting: Orthopaedic Surgery

## 2018-07-26 ENCOUNTER — Encounter (INDEPENDENT_AMBULATORY_CARE_PROVIDER_SITE_OTHER): Payer: Self-pay | Admitting: Orthopaedic Surgery

## 2018-07-26 VITALS — BP 129/65 | HR 69 | Ht 64.0 in | Wt 165.0 lb

## 2018-07-26 DIAGNOSIS — M1711 Unilateral primary osteoarthritis, right knee: Secondary | ICD-10-CM

## 2018-07-26 MED ORDER — HYALURONAN 30 MG/2ML IX SOSY
30.0000 mg | PREFILLED_SYRINGE | INTRA_ARTICULAR | Status: AC | PRN
  Administered 2018-07-26: 30 mg via INTRA_ARTICULAR

## 2018-07-26 NOTE — Progress Notes (Signed)
   Procedure Note  Patient: Becky Galvan             Date of Birth: April 29, 1946           MRN: [id number]             Visit Date: 07/26/2018  Procedures: Visit Diagnoses: Unilateral primary osteoarthritis, right knee - Plan: Large Joint Inj: R knee  Large Joint Inj: R knee on 07/26/2018 3:05 PM Indications: pain and joint swelling Details: 25 G 1.5 in needle  Arthrogram: No  Medications: 30 mg Hyaluronan 30 MG/2ML Outcome: tolerated well, no immediate complications Procedure, treatment alternatives, risks and benefits explained, specific risks discussed. Consent was given by the patient. Immediately prior to procedure a time out was called to verify the correct patient, procedure, equipment, support staff and site/side marked as required. Patient was prepped and draped in the usual sterile fashion.    Third Orthovisc injection right knee given without problem.  Will return as needed.  Not sure as yet that she has had any significant relief

## 2018-07-28 DIAGNOSIS — I10 Essential (primary) hypertension: Secondary | ICD-10-CM | POA: Diagnosis not present

## 2018-07-28 DIAGNOSIS — J301 Allergic rhinitis due to pollen: Secondary | ICD-10-CM | POA: Diagnosis not present

## 2018-07-28 DIAGNOSIS — E782 Mixed hyperlipidemia: Secondary | ICD-10-CM | POA: Diagnosis not present

## 2018-07-28 DIAGNOSIS — M25561 Pain in right knee: Secondary | ICD-10-CM | POA: Diagnosis not present

## 2018-07-28 DIAGNOSIS — R5383 Other fatigue: Secondary | ICD-10-CM | POA: Diagnosis not present

## 2018-07-28 DIAGNOSIS — R300 Dysuria: Secondary | ICD-10-CM | POA: Diagnosis not present

## 2018-07-28 DIAGNOSIS — N189 Chronic kidney disease, unspecified: Secondary | ICD-10-CM | POA: Diagnosis not present

## 2018-07-28 DIAGNOSIS — R0602 Shortness of breath: Secondary | ICD-10-CM | POA: Diagnosis not present

## 2018-08-02 ENCOUNTER — Ambulatory Visit (INDEPENDENT_AMBULATORY_CARE_PROVIDER_SITE_OTHER): Payer: Medicare Other | Admitting: Orthopaedic Surgery

## 2018-08-14 DIAGNOSIS — J209 Acute bronchitis, unspecified: Secondary | ICD-10-CM | POA: Diagnosis not present

## 2018-08-14 DIAGNOSIS — Z6829 Body mass index (BMI) 29.0-29.9, adult: Secondary | ICD-10-CM | POA: Diagnosis not present

## 2018-08-14 DIAGNOSIS — R05 Cough: Secondary | ICD-10-CM | POA: Diagnosis not present

## 2018-08-16 DIAGNOSIS — J189 Pneumonia, unspecified organism: Secondary | ICD-10-CM | POA: Diagnosis not present

## 2018-08-16 DIAGNOSIS — R05 Cough: Secondary | ICD-10-CM | POA: Diagnosis not present

## 2018-08-16 DIAGNOSIS — Z6828 Body mass index (BMI) 28.0-28.9, adult: Secondary | ICD-10-CM | POA: Diagnosis not present

## 2018-12-08 DIAGNOSIS — E782 Mixed hyperlipidemia: Secondary | ICD-10-CM | POA: Diagnosis not present

## 2018-12-08 DIAGNOSIS — N189 Chronic kidney disease, unspecified: Secondary | ICD-10-CM | POA: Diagnosis not present

## 2018-12-08 DIAGNOSIS — I10 Essential (primary) hypertension: Secondary | ICD-10-CM | POA: Diagnosis not present

## 2018-12-08 DIAGNOSIS — E7800 Pure hypercholesterolemia, unspecified: Secondary | ICD-10-CM | POA: Diagnosis not present

## 2018-12-15 DIAGNOSIS — R5383 Other fatigue: Secondary | ICD-10-CM | POA: Diagnosis not present

## 2018-12-15 DIAGNOSIS — I10 Essential (primary) hypertension: Secondary | ICD-10-CM | POA: Diagnosis not present

## 2018-12-15 DIAGNOSIS — M25561 Pain in right knee: Secondary | ICD-10-CM | POA: Diagnosis not present

## 2018-12-15 DIAGNOSIS — R0602 Shortness of breath: Secondary | ICD-10-CM | POA: Diagnosis not present

## 2018-12-15 DIAGNOSIS — Z6828 Body mass index (BMI) 28.0-28.9, adult: Secondary | ICD-10-CM | POA: Diagnosis not present

## 2018-12-15 DIAGNOSIS — E782 Mixed hyperlipidemia: Secondary | ICD-10-CM | POA: Diagnosis not present

## 2018-12-15 DIAGNOSIS — N189 Chronic kidney disease, unspecified: Secondary | ICD-10-CM | POA: Diagnosis not present

## 2018-12-15 DIAGNOSIS — R300 Dysuria: Secondary | ICD-10-CM | POA: Diagnosis not present

## 2019-01-03 ENCOUNTER — Ambulatory Visit (INDEPENDENT_AMBULATORY_CARE_PROVIDER_SITE_OTHER): Payer: Medicare Other | Admitting: Orthopaedic Surgery

## 2019-01-03 ENCOUNTER — Encounter: Payer: Self-pay | Admitting: Orthopaedic Surgery

## 2019-01-03 ENCOUNTER — Other Ambulatory Visit: Payer: Self-pay

## 2019-01-03 VITALS — BP 138/72 | HR 70 | Ht 65.5 in | Wt 176.0 lb

## 2019-01-03 DIAGNOSIS — M1711 Unilateral primary osteoarthritis, right knee: Secondary | ICD-10-CM | POA: Diagnosis not present

## 2019-01-03 MED ORDER — BUPIVACAINE HCL 0.5 % IJ SOLN
2.0000 mL | INTRAMUSCULAR | Status: AC | PRN
  Administered 2019-01-03: 15:00:00 2 mL via INTRA_ARTICULAR

## 2019-01-03 MED ORDER — LIDOCAINE HCL 1 % IJ SOLN
2.0000 mL | INTRAMUSCULAR | Status: AC | PRN
  Administered 2019-01-03: 2 mL

## 2019-01-03 MED ORDER — METHYLPREDNISOLONE ACETATE 40 MG/ML IJ SUSP
80.0000 mg | INTRAMUSCULAR | Status: AC | PRN
  Administered 2019-01-03: 15:00:00 80 mg via INTRA_ARTICULAR

## 2019-01-03 NOTE — Progress Notes (Signed)
Office Visit Note   Patient: Becky Galvan           Date of Birth: 04/03/1946           MRN: [id number] Visit Date: 01/03/2019              Requested by: Caryl Bis, MD [place],  [place] [address] PCP: Caryl Bis, MD   Assessment & Plan: Visit Diagnoses:  1. Unilateral primary osteoarthritis, right knee     Plan: Acute exacerbation of arthritis right knee.  Long discussion regarding her diagnosis and what she can expect over time.  Prior films demonstrates advanced osteoarthritis with near bone-on-bone in the medial compartment.  Becky Galvan is not ready for knee replacement but would like to have a cortisone injection Follow-Up Instructions: Return if symptoms worsen or fail to improve.   Orders:  Orders Placed This Encounter  Procedures  . Large Joint Inj: R knee   No orders of the defined types were placed in this encounter.     Procedures: Large Joint Inj: R knee on 01/03/2019 2:36 PM Indications: pain and diagnostic evaluation Details: 25 G 1.5 in needle, anteromedial approach  Arthrogram: No  Medications: 2 mL lidocaine 1 %; 2 mL bupivacaine 0.5 %; 80 mg methylPREDNISolone acetate 40 MG/ML Procedure, treatment alternatives, risks and benefits explained, specific risks discussed. Consent was given by the patient. Immediately prior to procedure a time out was called to verify the correct patient, procedure, equipment, support staff and site/side marked as required. Patient was prepped and draped in the usual sterile fashion.       Clinical Data: No additional findings.   Subjective: Chief Complaint  Patient presents with  . Right Knee - Pain  Patient presents today for right knee pain. She was getting out of her recliner quickly one week ago and noticed her leg was locked up. She said that she did not fall, but her leg has been painful. She feels like her leg is improving. She is using Biofreeze and taking Advil as needed. She finished orthovisc in  her right knee in February of 2020.  HPI  Review of Systems   Objective: Vital Signs: BP 138/72   Pulse 70   Ht 5' 5.5" (1.664 m)   Wt 176 lb (79.8 kg)   BMI 28.84 kg/m   Physical Exam Constitutional:      Appearance: She is well-developed.  Eyes:     Pupils: Pupils are equal, round, and reactive to light.  Pulmonary:     Effort: Pulmonary effort is normal.  Skin:    General: Skin is warm and dry.  Neurological:     Mental Status: She is alert and oriented to person, place, and time.  Psychiatric:        Behavior: Behavior normal.     Ortho Exam right knee with very small effusion.  Predominately medial joint pain.  Lacks a few degrees to full extension and flexed over 100 degrees without instability.  No popliteal pain.  No calf pain.  Neurologically intact.  Straight leg raise negative.  Painless range of motion right hip  Specialty Comments:  No specialty comments available.  Imaging: No results found.   PMFS History: Patient Active Problem List   Diagnosis Date Noted  . Guaiac + stool 01/06/2017  . Unilateral primary osteoarthritis, right knee 06/02/2016   Past Medical History:  Diagnosis Date  . Depression   . Guaiac + stool 01/06/2017  .  Hypercholesteremia   . Hypertension   . Seasonal allergies     Family History  Problem Relation Age of Onset  . Breast cancer Mother [id number]  . Breast cancer Cousin   . Breast cancer Cousin   . Breast cancer Cousin   . Colon cancer Neg Hx     Past Surgical History:  Procedure Laterality Date  . BREAST EXCISIONAL BIOPSY Right   . COLONOSCOPY N/A 04/06/2017   Procedure: COLONOSCOPY;  Surgeon: Malissa Hippoehman, Najeeb U, MD;  Location: [facility];  Service: Endoscopy;  Laterality: N/A;  125  . LAPAROSCOPY    . right knee arthroscopy     Social History   Occupational History  . Not on file  Tobacco Use  . Smoking status: Never Smoker  . Smokeless tobacco: Never Used  Substance and Sexual Activity  . Alcohol use: No   . Drug use: No  . Sexual activity: Not on file

## 2019-02-02 DIAGNOSIS — H04123 Dry eye syndrome of bilateral lacrimal glands: Secondary | ICD-10-CM | POA: Diagnosis not present

## 2019-04-06 DIAGNOSIS — I10 Essential (primary) hypertension: Secondary | ICD-10-CM | POA: Diagnosis not present

## 2019-04-06 DIAGNOSIS — R5383 Other fatigue: Secondary | ICD-10-CM | POA: Diagnosis not present

## 2019-04-06 DIAGNOSIS — E7800 Pure hypercholesterolemia, unspecified: Secondary | ICD-10-CM | POA: Diagnosis not present

## 2019-04-06 DIAGNOSIS — Z23 Encounter for immunization: Secondary | ICD-10-CM | POA: Diagnosis not present

## 2019-04-06 DIAGNOSIS — E782 Mixed hyperlipidemia: Secondary | ICD-10-CM | POA: Diagnosis not present

## 2019-04-06 DIAGNOSIS — N189 Chronic kidney disease, unspecified: Secondary | ICD-10-CM | POA: Diagnosis not present

## 2019-04-13 DIAGNOSIS — N189 Chronic kidney disease, unspecified: Secondary | ICD-10-CM | POA: Diagnosis not present

## 2019-04-13 DIAGNOSIS — R0602 Shortness of breath: Secondary | ICD-10-CM | POA: Diagnosis not present

## 2019-04-13 DIAGNOSIS — I10 Essential (primary) hypertension: Secondary | ICD-10-CM | POA: Diagnosis not present

## 2019-04-13 DIAGNOSIS — J301 Allergic rhinitis due to pollen: Secondary | ICD-10-CM | POA: Diagnosis not present

## 2019-04-13 DIAGNOSIS — R5383 Other fatigue: Secondary | ICD-10-CM | POA: Diagnosis not present

## 2019-04-13 DIAGNOSIS — M25561 Pain in right knee: Secondary | ICD-10-CM | POA: Diagnosis not present

## 2019-04-13 DIAGNOSIS — E782 Mixed hyperlipidemia: Secondary | ICD-10-CM | POA: Diagnosis not present

## 2019-04-20 DIAGNOSIS — E782 Mixed hyperlipidemia: Secondary | ICD-10-CM | POA: Diagnosis not present

## 2019-04-20 DIAGNOSIS — I10 Essential (primary) hypertension: Secondary | ICD-10-CM | POA: Diagnosis not present

## 2019-05-16 ENCOUNTER — Other Ambulatory Visit: Payer: Self-pay

## 2019-05-25 ENCOUNTER — Other Ambulatory Visit: Payer: Self-pay | Admitting: General Practice

## 2019-05-25 DIAGNOSIS — Z1231 Encounter for screening mammogram for malignant neoplasm of breast: Secondary | ICD-10-CM

## 2019-07-04 ENCOUNTER — Encounter: Payer: Self-pay | Admitting: Orthopaedic Surgery

## 2019-07-04 ENCOUNTER — Ambulatory Visit (INDEPENDENT_AMBULATORY_CARE_PROVIDER_SITE_OTHER): Payer: Medicare Other | Admitting: Orthopaedic Surgery

## 2019-07-04 VITALS — Ht 66.0 in | Wt 178.0 lb

## 2019-07-04 DIAGNOSIS — M1711 Unilateral primary osteoarthritis, right knee: Secondary | ICD-10-CM | POA: Diagnosis not present

## 2019-07-04 MED ORDER — LIDOCAINE HCL 1 % IJ SOLN
2.0000 mL | INTRAMUSCULAR | Status: AC | PRN
  Administered 2019-07-04: 2 mL

## 2019-07-04 MED ORDER — METHYLPREDNISOLONE ACETATE 40 MG/ML IJ SUSP
80.0000 mg | INTRAMUSCULAR | Status: AC | PRN
  Administered 2019-07-04: 10:00:00 80 mg via INTRA_ARTICULAR

## 2019-07-04 MED ORDER — BUPIVACAINE HCL 0.5 % IJ SOLN
2.0000 mL | INTRAMUSCULAR | Status: AC | PRN
  Administered 2019-07-04: 2 mL via INTRA_ARTICULAR

## 2019-07-04 NOTE — Progress Notes (Signed)
Office Visit Note   Patient: Becky Galvan           Date of Birth: 02-26-1946           MRN: [id number] Visit Date: 07/04/2019              Requested by: Richardean Chimera, MD [address] [place],  [place] [address] PCP: Richardean Chimera, MD   Assessment & Plan: Visit Diagnoses:  1. Unilateral primary osteoarthritis, right knee     Plan: Recurrent symptoms of osteoarthritis right knee.  Prior films demonstrated near end-stage osteoarthritis.  Will repeat cortisone injection.  Last injection was in July.  Still not interested in knee replacement  Follow-Up Instructions: Return if symptoms worsen or fail to improve.   Orders:  Orders Placed This Encounter  Procedures  . Large Joint Inj: R knee   No orders of the defined types were placed in this encounter.     Procedures: Large Joint Inj: R knee on 07/04/2019 9:45 AM Indications: pain and diagnostic evaluation Details: 25 G 1.5 in needle, anterolateral approach  Arthrogram: No  Medications: 2 mL lidocaine 1 %; 2 mL bupivacaine 0.5 %; 80 mg methylPREDNISolone acetate 40 MG/ML Procedure, treatment alternatives, risks and benefits explained, specific risks discussed. Consent was given by the patient. Immediately prior to procedure a time out was called to verify the correct patient, procedure, equipment, support staff and site/side marked as required. Patient was prepped and draped in the usual sterile fashion.       Clinical Data: No additional findings.   Subjective: Chief Complaint  Patient presents with  . Right Knee - Pain  Patient presents today for her right knee. She was last evaluated and injected with cortisone in July of 2020. She said that the injection was great, but has been hurting again in the last two weeks. She is taking Advil as needed. She wants to get another injection of cortisone today.   HPI  Review of Systems   Objective: Vital Signs: Ht 5\' 6"  (1.676 m)   Wt 178 lb (80.7 kg)   BMI 28.73 kg/m    Physical Exam Constitutional:      Appearance: She is well-developed.  Eyes:     Pupils: Pupils are equal, round, and reactive to light.  Pulmonary:     Effort: Pulmonary effort is normal.  Skin:    General: Skin is warm and dry.  Neurological:     Mental Status: She is alert and oriented to person, place, and time.  Psychiatric:        Behavior: Behavior normal.     Ortho Exam left knee without effusion.  Predominately lateral joint pain although prior films demonstrated mostly medial compartment arthritis.  Some patellar crepitation.  Full extension of flexion over 100 degrees without instability.  No popliteal pain.  No calf pain.  No distal edema  Specialty Comments:  No specialty comments available.  Imaging: No results found.   PMFS History: Patient Active Problem List   Diagnosis Date Noted  . Guaiac + stool 01/06/2017  . Unilateral primary osteoarthritis, right knee 06/02/2016   Past Medical History:  Diagnosis Date  . Depression   . Guaiac + stool 01/06/2017  . Hypercholesteremia   . Hypertension   . Seasonal allergies     Family History  Problem Relation Age of Onset  . Breast cancer Mother 69  . Breast cancer Cousin   . Breast cancer Cousin   . Breast  cancer Cousin   . Colon cancer Neg Hx     Past Surgical History:  Procedure Laterality Date  . BREAST EXCISIONAL BIOPSY Right   . COLONOSCOPY N/A 04/06/2017   Procedure: COLONOSCOPY;  Surgeon: Rogene Houston, MD;  Location: [facility];  Service: Endoscopy;  Laterality: N/A;  125  . LAPAROSCOPY    . right knee arthroscopy     Social History   Occupational History  . Not on file  Tobacco Use  . Smoking status: Never Smoker  . Smokeless tobacco: Never Used  Substance and Sexual Activity  . Alcohol use: No  . Drug use: No  . Sexual activity: Not on file

## 2019-07-20 ENCOUNTER — Ambulatory Visit: Payer: Medicare Other

## 2019-07-20 DIAGNOSIS — I10 Essential (primary) hypertension: Secondary | ICD-10-CM | POA: Diagnosis not present

## 2019-07-20 DIAGNOSIS — E7849 Other hyperlipidemia: Secondary | ICD-10-CM | POA: Diagnosis not present

## 2019-08-10 DIAGNOSIS — E7800 Pure hypercholesterolemia, unspecified: Secondary | ICD-10-CM | POA: Diagnosis not present

## 2019-08-10 DIAGNOSIS — N189 Chronic kidney disease, unspecified: Secondary | ICD-10-CM | POA: Diagnosis not present

## 2019-08-10 DIAGNOSIS — E782 Mixed hyperlipidemia: Secondary | ICD-10-CM | POA: Diagnosis not present

## 2019-08-10 DIAGNOSIS — I10 Essential (primary) hypertension: Secondary | ICD-10-CM | POA: Diagnosis not present

## 2019-08-10 DIAGNOSIS — R5383 Other fatigue: Secondary | ICD-10-CM | POA: Diagnosis not present

## 2019-08-10 DIAGNOSIS — F331 Major depressive disorder, recurrent, moderate: Secondary | ICD-10-CM | POA: Diagnosis not present

## 2019-08-17 ENCOUNTER — Ambulatory Visit: Payer: Medicare Other

## 2019-08-17 ENCOUNTER — Other Ambulatory Visit: Payer: Self-pay

## 2019-08-17 DIAGNOSIS — E782 Mixed hyperlipidemia: Secondary | ICD-10-CM | POA: Diagnosis not present

## 2019-08-17 DIAGNOSIS — J301 Allergic rhinitis due to pollen: Secondary | ICD-10-CM | POA: Diagnosis not present

## 2019-08-17 DIAGNOSIS — M25561 Pain in right knee: Secondary | ICD-10-CM | POA: Diagnosis not present

## 2019-08-17 DIAGNOSIS — N189 Chronic kidney disease, unspecified: Secondary | ICD-10-CM | POA: Diagnosis not present

## 2019-08-17 DIAGNOSIS — I10 Essential (primary) hypertension: Secondary | ICD-10-CM | POA: Diagnosis not present

## 2019-08-17 DIAGNOSIS — E7849 Other hyperlipidemia: Secondary | ICD-10-CM | POA: Diagnosis not present

## 2019-08-17 DIAGNOSIS — R5383 Other fatigue: Secondary | ICD-10-CM | POA: Diagnosis not present

## 2019-08-17 DIAGNOSIS — R0602 Shortness of breath: Secondary | ICD-10-CM | POA: Diagnosis not present

## 2019-09-19 DIAGNOSIS — I10 Essential (primary) hypertension: Secondary | ICD-10-CM | POA: Diagnosis not present

## 2019-09-19 DIAGNOSIS — E7849 Other hyperlipidemia: Secondary | ICD-10-CM | POA: Diagnosis not present

## 2019-10-05 DIAGNOSIS — Z23 Encounter for immunization: Secondary | ICD-10-CM | POA: Diagnosis not present

## 2019-10-19 ENCOUNTER — Ambulatory Visit
Admission: RE | Admit: 2019-10-19 | Discharge: 2019-10-19 | Disposition: A | Discharge status: Home / Self Care | Payer: Medicare Other | Source: Ambulatory Visit | Attending: General Practice | Admitting: General Practice

## 2019-10-19 ENCOUNTER — Other Ambulatory Visit: Payer: Self-pay

## 2019-10-19 DIAGNOSIS — E7849 Other hyperlipidemia: Secondary | ICD-10-CM | POA: Diagnosis not present

## 2019-10-19 DIAGNOSIS — Z1231 Encounter for screening mammogram for malignant neoplasm of breast: Secondary | ICD-10-CM | POA: Diagnosis not present

## 2019-10-19 DIAGNOSIS — I10 Essential (primary) hypertension: Secondary | ICD-10-CM | POA: Diagnosis not present

## 2019-12-05 ENCOUNTER — Ambulatory Visit (INDEPENDENT_AMBULATORY_CARE_PROVIDER_SITE_OTHER): Payer: Medicare Other | Admitting: Orthopaedic Surgery

## 2019-12-05 ENCOUNTER — Encounter: Payer: Self-pay | Admitting: Orthopaedic Surgery

## 2019-12-05 ENCOUNTER — Ambulatory Visit: Payer: Self-pay

## 2019-12-05 ENCOUNTER — Other Ambulatory Visit: Payer: Self-pay

## 2019-12-05 VITALS — Ht 66.0 in | Wt 177.0 lb

## 2019-12-05 DIAGNOSIS — M25551 Pain in right hip: Secondary | ICD-10-CM | POA: Diagnosis not present

## 2019-12-05 DIAGNOSIS — G8929 Other chronic pain: Secondary | ICD-10-CM

## 2019-12-05 DIAGNOSIS — M545 Low back pain, unspecified: Secondary | ICD-10-CM | POA: Insufficient documentation

## 2019-12-05 DIAGNOSIS — M1711 Unilateral primary osteoarthritis, right knee: Secondary | ICD-10-CM

## 2019-12-05 NOTE — Progress Notes (Signed)
Office Visit Note   Patient: Becky Galvan           Date of Birth: 04/03/1946           MRN: [id number] Visit Date: 12/05/2019              Requested by: Richardean Chimera, MD [address] [place],  [place] [address] PCP: Richardean Chimera, MD   Assessment & Plan: Visit Diagnoses:  1. Unilateral primary osteoarthritis, right knee   2. Pain in right hip   3. Chronic right-sided low back pain without sciatica     Plan: Mrs. Bainter has end-stage osteoarthritis of the right knee.  She is done well over many years with the arthritis.  We a long discussion regarding treatment options including knee replacement.  For the moment she will work on exercises and continue with over-the-counter medicines.  There has not been much change in her films over the last 4 years but she does have bone-on-bone in the medial compartment.  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  She is not having any sciatica.  I think a course of physical therapy and follow-up exercises would be very helpful.  At some point he may want to consider an MRI scan she may be a candidate for facet injections.  She will let me know  Follow-Up Instructions: Return if symptoms worsen or fail to improve.   Orders:  Orders Placed This Encounter  Procedures  . XR Lumbar Spine 2-3 Views  . XR KNEE 3 VIEW RIGHT  . Ambulatory referral to Physical Therapy   No orders of the defined types were placed in this encounter.     Procedures: No procedures performed   Clinical Data: No additional findings.   Subjective: Chief Complaint  Patient presents with  . Right Hip - Pain  . Right Knee - Pain  Patient presents today for right hip pain. She said that her hip has been hurting laterally for two months. She said that it comes and goes. She goes to gym and has noticed that stretching really seems to help. No groin or buttock pain. She will sometimes  have lower back pain. She also has recurrent right knee pain. She received her last cortisone injection in January 2021. The injection helped until the end of March. She takes Advil as needed.  No related numbness or tingling in either lower extremity.  Does have bilateral knee pain which seems to be different from her back discomfort.  No groin discomfort HPI  Review of Systems   Objective: Vital Signs: Ht 5\' 6"  (1.676 m)   Wt 177 lb (80.3 kg)   BMI 28.57 kg/m   Physical Exam Constitutional:      Appearance: She is well-developed.  Eyes:     Pupils: Pupils are equal, round, and reactive to light.  Pulmonary:     Effort: Pulmonary effort is normal.  Skin:    General: Skin is warm and dry.  Neurological:     Mental Status: She is alert and oriented to person, place, and time.  Psychiatric:        Behavior: Behavior normal.     Ortho Exam awake alert and oriented x3.  Comfortable sitting.  Lacks just a few degrees to full knee extension with minimal effusion.  Predominant medial joint pain.  Some patellar crepitation.  No instability.  No popliteal pain.  No calf pain.  Straight leg raise negative.  Painless range of motion both hips.  Very minimal percussible tenderness in the lower lumbar spine.  No distal edema.  Motor exam intact  Specialty Comments:  No specialty comments available.  Imaging: XR KNEE 3 VIEW RIGHT  Result Date: 12/05/2019 Films of the right knee were obtained in 3 projections standing.  Is bone-on-bone in the medial compartment with peripheral osteophytes and subchondral sclerosis.  There is approximately 5 to 6 degrees of varus with no ectopic calcification.  Degenerative changes identified in the patellofemoral compartment as well as the lateral compartment.  Films are consistent with advanced osteoarthritis predominate the medial compartment without acute change.  XR Lumbar Spine 2-3 Views  Result Date: 12/05/2019 Films lumbar spine obtained in 2  projections.  There are degenerative changes with narrowing of the disc space height at L2-3 and L5-S1.  No obvious listhesis.  Anterior osteophytes identified from L2-L5.  Degenerative scoliosis to the left involving T12 and L4.  Diffuse facet sclerosis identified at L4-5 and L5-S1.  Films are consistent with osteoarthritis    PMFS History: Patient Active Problem List   Diagnosis Date Noted  . Low back pain 12/05/2019  . Guaiac + stool 01/06/2017  . Unilateral primary osteoarthritis, right knee 06/02/2016   Past Medical History:  Diagnosis Date  . Depression   . Guaiac + stool 01/06/2017  . Hypercholesteremia   . Hypertension   . Seasonal allergies     Family History  Problem Relation Age of Onset  . Breast cancer Mother 28  . Breast cancer Cousin   . Breast cancer Cousin   . Breast cancer Cousin   . Colon cancer Neg Hx     Past Surgical History:  Procedure Laterality Date  . BREAST EXCISIONAL BIOPSY Right   . COLONOSCOPY N/A 04/06/2017   Procedure: COLONOSCOPY;  Surgeon: Rogene Houston, MD;  Location: [facility];  Service: Endoscopy;  Laterality: N/A;  125  . LAPAROSCOPY    . right knee arthroscopy     Social History   Occupational History  . Not on file  Tobacco Use  . Smoking status: Never Smoker  . Smokeless tobacco: Never Used  Vaping Use  . Vaping Use: Never used  Substance and Sexual Activity  . Alcohol use: No  . Drug use: No  . Sexual activity: Not on file

## 2019-12-14 DIAGNOSIS — G8929 Other chronic pain: Secondary | ICD-10-CM | POA: Diagnosis not present

## 2019-12-14 DIAGNOSIS — M545 Low back pain: Secondary | ICD-10-CM | POA: Diagnosis not present

## 2020-01-17 DIAGNOSIS — M810 Age-related osteoporosis without current pathological fracture: Secondary | ICD-10-CM | POA: Diagnosis not present

## 2020-01-17 DIAGNOSIS — M85852 Other specified disorders of bone density and structure, left thigh: Secondary | ICD-10-CM | POA: Diagnosis not present

## 2020-01-17 DIAGNOSIS — Z780 Asymptomatic menopausal state: Secondary | ICD-10-CM | POA: Diagnosis not present

## 2020-01-18 DIAGNOSIS — F331 Major depressive disorder, recurrent, moderate: Secondary | ICD-10-CM | POA: Diagnosis not present

## 2020-01-18 DIAGNOSIS — I129 Hypertensive chronic kidney disease with stage 1 through stage 4 chronic kidney disease, or unspecified chronic kidney disease: Secondary | ICD-10-CM | POA: Diagnosis not present

## 2020-01-18 DIAGNOSIS — N1831 Chronic kidney disease, stage 3a: Secondary | ICD-10-CM | POA: Diagnosis not present

## 2020-01-18 DIAGNOSIS — E7849 Other hyperlipidemia: Secondary | ICD-10-CM | POA: Diagnosis not present

## 2020-02-07 DIAGNOSIS — H04123 Dry eye syndrome of bilateral lacrimal glands: Secondary | ICD-10-CM | POA: Diagnosis not present

## 2020-02-19 DIAGNOSIS — F331 Major depressive disorder, recurrent, moderate: Secondary | ICD-10-CM | POA: Diagnosis not present

## 2020-02-19 DIAGNOSIS — N1831 Chronic kidney disease, stage 3a: Secondary | ICD-10-CM | POA: Diagnosis not present

## 2020-02-19 DIAGNOSIS — I129 Hypertensive chronic kidney disease with stage 1 through stage 4 chronic kidney disease, or unspecified chronic kidney disease: Secondary | ICD-10-CM | POA: Diagnosis not present

## 2020-02-19 DIAGNOSIS — E7849 Other hyperlipidemia: Secondary | ICD-10-CM | POA: Diagnosis not present

## 2020-04-18 DIAGNOSIS — N189 Chronic kidney disease, unspecified: Secondary | ICD-10-CM | POA: Diagnosis not present

## 2020-04-18 DIAGNOSIS — Z23 Encounter for immunization: Secondary | ICD-10-CM | POA: Diagnosis not present

## 2020-04-18 DIAGNOSIS — I10 Essential (primary) hypertension: Secondary | ICD-10-CM | POA: Diagnosis not present

## 2020-04-18 DIAGNOSIS — E782 Mixed hyperlipidemia: Secondary | ICD-10-CM | POA: Diagnosis not present

## 2020-04-18 DIAGNOSIS — E7800 Pure hypercholesterolemia, unspecified: Secondary | ICD-10-CM | POA: Diagnosis not present

## 2020-04-19 DIAGNOSIS — N1831 Chronic kidney disease, stage 3a: Secondary | ICD-10-CM | POA: Diagnosis not present

## 2020-04-19 DIAGNOSIS — I129 Hypertensive chronic kidney disease with stage 1 through stage 4 chronic kidney disease, or unspecified chronic kidney disease: Secondary | ICD-10-CM | POA: Diagnosis not present

## 2020-04-19 DIAGNOSIS — E7849 Other hyperlipidemia: Secondary | ICD-10-CM | POA: Diagnosis not present

## 2020-04-25 DIAGNOSIS — Z1331 Encounter for screening for depression: Secondary | ICD-10-CM | POA: Diagnosis not present

## 2020-04-25 DIAGNOSIS — R0602 Shortness of breath: Secondary | ICD-10-CM | POA: Diagnosis not present

## 2020-04-25 DIAGNOSIS — Z6829 Body mass index (BMI) 29.0-29.9, adult: Secondary | ICD-10-CM | POA: Diagnosis not present

## 2020-04-25 DIAGNOSIS — Z1389 Encounter for screening for other disorder: Secondary | ICD-10-CM | POA: Diagnosis not present

## 2020-04-25 DIAGNOSIS — E782 Mixed hyperlipidemia: Secondary | ICD-10-CM | POA: Diagnosis not present

## 2020-04-25 DIAGNOSIS — I10 Essential (primary) hypertension: Secondary | ICD-10-CM | POA: Diagnosis not present

## 2020-04-25 DIAGNOSIS — Z0000 Encounter for general adult medical examination without abnormal findings: Secondary | ICD-10-CM | POA: Diagnosis not present

## 2020-04-25 DIAGNOSIS — Z23 Encounter for immunization: Secondary | ICD-10-CM | POA: Diagnosis not present

## 2020-07-19 DIAGNOSIS — F331 Major depressive disorder, recurrent, moderate: Secondary | ICD-10-CM | POA: Diagnosis not present

## 2020-07-19 DIAGNOSIS — I129 Hypertensive chronic kidney disease with stage 1 through stage 4 chronic kidney disease, or unspecified chronic kidney disease: Secondary | ICD-10-CM | POA: Diagnosis not present

## 2020-07-19 DIAGNOSIS — N1831 Chronic kidney disease, stage 3a: Secondary | ICD-10-CM | POA: Diagnosis not present

## 2020-07-19 DIAGNOSIS — E7849 Other hyperlipidemia: Secondary | ICD-10-CM | POA: Diagnosis not present

## 2020-08-15 DIAGNOSIS — I10 Essential (primary) hypertension: Secondary | ICD-10-CM | POA: Diagnosis not present

## 2020-08-15 DIAGNOSIS — E782 Mixed hyperlipidemia: Secondary | ICD-10-CM | POA: Diagnosis not present

## 2020-08-15 DIAGNOSIS — E7849 Other hyperlipidemia: Secondary | ICD-10-CM | POA: Diagnosis not present

## 2020-08-15 DIAGNOSIS — N189 Chronic kidney disease, unspecified: Secondary | ICD-10-CM | POA: Diagnosis not present

## 2020-08-18 DIAGNOSIS — N1831 Chronic kidney disease, stage 3a: Secondary | ICD-10-CM | POA: Diagnosis not present

## 2020-08-18 DIAGNOSIS — F331 Major depressive disorder, recurrent, moderate: Secondary | ICD-10-CM | POA: Diagnosis not present

## 2020-08-18 DIAGNOSIS — I129 Hypertensive chronic kidney disease with stage 1 through stage 4 chronic kidney disease, or unspecified chronic kidney disease: Secondary | ICD-10-CM | POA: Diagnosis not present

## 2020-08-18 DIAGNOSIS — E7849 Other hyperlipidemia: Secondary | ICD-10-CM | POA: Diagnosis not present

## 2020-08-22 DIAGNOSIS — Z23 Encounter for immunization: Secondary | ICD-10-CM | POA: Diagnosis not present

## 2020-08-22 DIAGNOSIS — F331 Major depressive disorder, recurrent, moderate: Secondary | ICD-10-CM | POA: Diagnosis not present

## 2020-08-22 DIAGNOSIS — R5383 Other fatigue: Secondary | ICD-10-CM | POA: Diagnosis not present

## 2020-08-22 DIAGNOSIS — E7849 Other hyperlipidemia: Secondary | ICD-10-CM | POA: Diagnosis not present

## 2020-08-22 DIAGNOSIS — I10 Essential (primary) hypertension: Secondary | ICD-10-CM | POA: Diagnosis not present

## 2020-08-22 DIAGNOSIS — R0602 Shortness of breath: Secondary | ICD-10-CM | POA: Diagnosis not present

## 2020-08-22 DIAGNOSIS — N189 Chronic kidney disease, unspecified: Secondary | ICD-10-CM | POA: Diagnosis not present

## 2020-08-22 DIAGNOSIS — E7801 Familial hypercholesterolemia: Secondary | ICD-10-CM | POA: Diagnosis not present

## 2020-09-17 DIAGNOSIS — I129 Hypertensive chronic kidney disease with stage 1 through stage 4 chronic kidney disease, or unspecified chronic kidney disease: Secondary | ICD-10-CM | POA: Diagnosis not present

## 2020-09-17 DIAGNOSIS — E7849 Other hyperlipidemia: Secondary | ICD-10-CM | POA: Diagnosis not present

## 2020-09-17 DIAGNOSIS — N1831 Chronic kidney disease, stage 3a: Secondary | ICD-10-CM | POA: Diagnosis not present

## 2020-09-17 DIAGNOSIS — F331 Major depressive disorder, recurrent, moderate: Secondary | ICD-10-CM | POA: Diagnosis not present

## 2020-09-24 ENCOUNTER — Other Ambulatory Visit: Payer: Self-pay | Admitting: General Practice

## 2020-09-24 DIAGNOSIS — Z1231 Encounter for screening mammogram for malignant neoplasm of breast: Secondary | ICD-10-CM

## 2020-10-18 DIAGNOSIS — F331 Major depressive disorder, recurrent, moderate: Secondary | ICD-10-CM | POA: Diagnosis not present

## 2020-10-18 DIAGNOSIS — N1831 Chronic kidney disease, stage 3a: Secondary | ICD-10-CM | POA: Diagnosis not present

## 2020-10-18 DIAGNOSIS — I129 Hypertensive chronic kidney disease with stage 1 through stage 4 chronic kidney disease, or unspecified chronic kidney disease: Secondary | ICD-10-CM | POA: Diagnosis not present

## 2020-10-18 DIAGNOSIS — E7849 Other hyperlipidemia: Secondary | ICD-10-CM | POA: Diagnosis not present

## 2020-11-14 ENCOUNTER — Ambulatory Visit: Payer: Medicare Other

## 2020-11-17 DIAGNOSIS — F331 Major depressive disorder, recurrent, moderate: Secondary | ICD-10-CM | POA: Diagnosis not present

## 2020-11-17 DIAGNOSIS — E7849 Other hyperlipidemia: Secondary | ICD-10-CM | POA: Diagnosis not present

## 2020-11-17 DIAGNOSIS — I129 Hypertensive chronic kidney disease with stage 1 through stage 4 chronic kidney disease, or unspecified chronic kidney disease: Secondary | ICD-10-CM | POA: Diagnosis not present

## 2020-11-17 DIAGNOSIS — N1831 Chronic kidney disease, stage 3a: Secondary | ICD-10-CM | POA: Diagnosis not present

## 2020-11-21 ENCOUNTER — Ambulatory Visit
Admission: RE | Admit: 2020-11-21 | Discharge: 2020-11-21 | Disposition: A | Discharge status: Home / Self Care | Payer: Medicare Other | Source: Ambulatory Visit | Attending: General Practice | Admitting: General Practice

## 2020-11-21 DIAGNOSIS — Z1231 Encounter for screening mammogram for malignant neoplasm of breast: Secondary | ICD-10-CM | POA: Diagnosis not present

## 2020-12-03 DIAGNOSIS — J029 Acute pharyngitis, unspecified: Secondary | ICD-10-CM | POA: Diagnosis not present

## 2020-12-03 DIAGNOSIS — J069 Acute upper respiratory infection, unspecified: Secondary | ICD-10-CM | POA: Diagnosis not present

## 2020-12-03 DIAGNOSIS — Z20828 Contact with and (suspected) exposure to other viral communicable diseases: Secondary | ICD-10-CM | POA: Diagnosis not present

## 2020-12-12 DIAGNOSIS — I10 Essential (primary) hypertension: Secondary | ICD-10-CM | POA: Diagnosis not present

## 2020-12-12 DIAGNOSIS — E782 Mixed hyperlipidemia: Secondary | ICD-10-CM | POA: Diagnosis not present

## 2020-12-12 DIAGNOSIS — R5383 Other fatigue: Secondary | ICD-10-CM | POA: Diagnosis not present

## 2020-12-12 DIAGNOSIS — E7849 Other hyperlipidemia: Secondary | ICD-10-CM | POA: Diagnosis not present

## 2020-12-16 DIAGNOSIS — M25561 Pain in right knee: Secondary | ICD-10-CM | POA: Diagnosis not present

## 2020-12-16 DIAGNOSIS — R7301 Impaired fasting glucose: Secondary | ICD-10-CM | POA: Diagnosis not present

## 2020-12-16 DIAGNOSIS — R5383 Other fatigue: Secondary | ICD-10-CM | POA: Diagnosis not present

## 2020-12-16 DIAGNOSIS — E7849 Other hyperlipidemia: Secondary | ICD-10-CM | POA: Diagnosis not present

## 2020-12-16 DIAGNOSIS — R0602 Shortness of breath: Secondary | ICD-10-CM | POA: Diagnosis not present

## 2020-12-16 DIAGNOSIS — N189 Chronic kidney disease, unspecified: Secondary | ICD-10-CM | POA: Diagnosis not present

## 2020-12-16 DIAGNOSIS — F331 Major depressive disorder, recurrent, moderate: Secondary | ICD-10-CM | POA: Diagnosis not present

## 2020-12-16 DIAGNOSIS — I10 Essential (primary) hypertension: Secondary | ICD-10-CM | POA: Diagnosis not present

## 2020-12-18 DIAGNOSIS — E7849 Other hyperlipidemia: Secondary | ICD-10-CM | POA: Diagnosis not present

## 2020-12-18 DIAGNOSIS — F331 Major depressive disorder, recurrent, moderate: Secondary | ICD-10-CM | POA: Diagnosis not present

## 2020-12-18 DIAGNOSIS — N1831 Chronic kidney disease, stage 3a: Secondary | ICD-10-CM | POA: Diagnosis not present

## 2020-12-18 DIAGNOSIS — I129 Hypertensive chronic kidney disease with stage 1 through stage 4 chronic kidney disease, or unspecified chronic kidney disease: Secondary | ICD-10-CM | POA: Diagnosis not present

## 2021-01-09 ENCOUNTER — Ambulatory Visit: Payer: Medicare Other

## 2021-01-18 DIAGNOSIS — N1831 Chronic kidney disease, stage 3a: Secondary | ICD-10-CM | POA: Diagnosis not present

## 2021-01-18 DIAGNOSIS — E7849 Other hyperlipidemia: Secondary | ICD-10-CM | POA: Diagnosis not present

## 2021-01-18 DIAGNOSIS — I129 Hypertensive chronic kidney disease with stage 1 through stage 4 chronic kidney disease, or unspecified chronic kidney disease: Secondary | ICD-10-CM | POA: Diagnosis not present

## 2021-01-18 DIAGNOSIS — F331 Major depressive disorder, recurrent, moderate: Secondary | ICD-10-CM | POA: Diagnosis not present

## 2021-08-28 ENCOUNTER — Other Ambulatory Visit: Payer: Self-pay | Admitting: General Practice

## 2021-08-28 DIAGNOSIS — Z1231 Encounter for screening mammogram for malignant neoplasm of breast: Secondary | ICD-10-CM

## 2021-11-27 ENCOUNTER — Ambulatory Visit
Admission: RE | Admit: 2021-11-27 | Discharge: 2021-11-27 | Disposition: A | Discharge status: Home / Self Care | Payer: Medicare Other | Source: Ambulatory Visit | Attending: General Practice | Admitting: General Practice

## 2021-11-27 DIAGNOSIS — Z1231 Encounter for screening mammogram for malignant neoplasm of breast: Secondary | ICD-10-CM

## 2022-09-22 DIAGNOSIS — L03011 Cellulitis of right finger: Secondary | ICD-10-CM | POA: Diagnosis not present

## 2022-10-01 DIAGNOSIS — N189 Chronic kidney disease, unspecified: Secondary | ICD-10-CM | POA: Diagnosis not present

## 2022-10-01 DIAGNOSIS — E7849 Other hyperlipidemia: Secondary | ICD-10-CM | POA: Diagnosis not present

## 2022-10-01 DIAGNOSIS — R7301 Impaired fasting glucose: Secondary | ICD-10-CM | POA: Diagnosis not present

## 2022-10-05 DIAGNOSIS — R7301 Impaired fasting glucose: Secondary | ICD-10-CM | POA: Diagnosis not present

## 2022-10-05 DIAGNOSIS — N189 Chronic kidney disease, unspecified: Secondary | ICD-10-CM | POA: Diagnosis not present

## 2022-10-05 DIAGNOSIS — L03011 Cellulitis of right finger: Secondary | ICD-10-CM | POA: Diagnosis not present

## 2022-10-05 DIAGNOSIS — M25561 Pain in right knee: Secondary | ICD-10-CM | POA: Diagnosis not present

## 2022-10-05 DIAGNOSIS — J301 Allergic rhinitis due to pollen: Secondary | ICD-10-CM | POA: Diagnosis not present

## 2022-10-05 DIAGNOSIS — Z6821 Body mass index (BMI) 21.0-21.9, adult: Secondary | ICD-10-CM | POA: Diagnosis not present

## 2022-10-05 DIAGNOSIS — R5383 Other fatigue: Secondary | ICD-10-CM | POA: Diagnosis not present

## 2022-10-05 DIAGNOSIS — E7849 Other hyperlipidemia: Secondary | ICD-10-CM | POA: Diagnosis not present

## 2022-10-05 DIAGNOSIS — R0602 Shortness of breath: Secondary | ICD-10-CM | POA: Diagnosis not present

## 2022-10-05 DIAGNOSIS — I10 Essential (primary) hypertension: Secondary | ICD-10-CM | POA: Diagnosis not present

## 2022-10-18 DIAGNOSIS — M25441 Effusion, right hand: Secondary | ICD-10-CM | POA: Diagnosis not present

## 2022-10-18 DIAGNOSIS — M25641 Stiffness of right hand, not elsewhere classified: Secondary | ICD-10-CM | POA: Diagnosis not present

## 2022-10-18 DIAGNOSIS — R531 Weakness: Secondary | ICD-10-CM | POA: Diagnosis not present

## 2022-10-18 DIAGNOSIS — M79641 Pain in right hand: Secondary | ICD-10-CM | POA: Diagnosis not present

## 2022-10-27 DIAGNOSIS — M25441 Effusion, right hand: Secondary | ICD-10-CM | POA: Diagnosis not present

## 2022-10-27 DIAGNOSIS — M79641 Pain in right hand: Secondary | ICD-10-CM | POA: Diagnosis not present

## 2022-10-27 DIAGNOSIS — M25641 Stiffness of right hand, not elsewhere classified: Secondary | ICD-10-CM | POA: Diagnosis not present

## 2022-10-27 DIAGNOSIS — R531 Weakness: Secondary | ICD-10-CM | POA: Diagnosis not present

## 2022-11-03 DIAGNOSIS — M25641 Stiffness of right hand, not elsewhere classified: Secondary | ICD-10-CM | POA: Diagnosis not present

## 2022-11-03 DIAGNOSIS — M79641 Pain in right hand: Secondary | ICD-10-CM | POA: Diagnosis not present

## 2022-11-03 DIAGNOSIS — M25441 Effusion, right hand: Secondary | ICD-10-CM | POA: Diagnosis not present

## 2022-11-03 DIAGNOSIS — R531 Weakness: Secondary | ICD-10-CM | POA: Diagnosis not present

## 2022-12-03 DIAGNOSIS — Z1231 Encounter for screening mammogram for malignant neoplasm of breast: Secondary | ICD-10-CM | POA: Diagnosis not present

## 2023-01-28 DIAGNOSIS — E7849 Other hyperlipidemia: Secondary | ICD-10-CM | POA: Diagnosis not present

## 2023-01-28 DIAGNOSIS — R5383 Other fatigue: Secondary | ICD-10-CM | POA: Diagnosis not present

## 2023-01-28 DIAGNOSIS — E782 Mixed hyperlipidemia: Secondary | ICD-10-CM | POA: Diagnosis not present

## 2023-01-28 DIAGNOSIS — E7800 Pure hypercholesterolemia, unspecified: Secondary | ICD-10-CM | POA: Diagnosis not present

## 2023-01-28 DIAGNOSIS — R7301 Impaired fasting glucose: Secondary | ICD-10-CM | POA: Diagnosis not present

## 2023-01-28 DIAGNOSIS — N189 Chronic kidney disease, unspecified: Secondary | ICD-10-CM | POA: Diagnosis not present

## 2023-02-02 DIAGNOSIS — R5383 Other fatigue: Secondary | ICD-10-CM | POA: Diagnosis not present

## 2023-02-02 DIAGNOSIS — N189 Chronic kidney disease, unspecified: Secondary | ICD-10-CM | POA: Diagnosis not present

## 2023-02-02 DIAGNOSIS — J301 Allergic rhinitis due to pollen: Secondary | ICD-10-CM | POA: Diagnosis not present

## 2023-02-02 DIAGNOSIS — R6883 Chills (without fever): Secondary | ICD-10-CM | POA: Diagnosis not present

## 2023-02-02 DIAGNOSIS — Z23 Encounter for immunization: Secondary | ICD-10-CM | POA: Diagnosis not present

## 2023-02-02 DIAGNOSIS — R0602 Shortness of breath: Secondary | ICD-10-CM | POA: Diagnosis not present

## 2023-02-02 DIAGNOSIS — R7301 Impaired fasting glucose: Secondary | ICD-10-CM | POA: Diagnosis not present

## 2023-02-02 DIAGNOSIS — I10 Essential (primary) hypertension: Secondary | ICD-10-CM | POA: Diagnosis not present

## 2023-02-02 DIAGNOSIS — M25561 Pain in right knee: Secondary | ICD-10-CM | POA: Diagnosis not present

## 2023-02-02 DIAGNOSIS — E7849 Other hyperlipidemia: Secondary | ICD-10-CM | POA: Diagnosis not present

## 2023-02-04 DIAGNOSIS — J029 Acute pharyngitis, unspecified: Secondary | ICD-10-CM | POA: Diagnosis not present

## 2023-02-04 DIAGNOSIS — B349 Viral infection, unspecified: Secondary | ICD-10-CM | POA: Diagnosis not present

## 2023-02-04 DIAGNOSIS — R030 Elevated blood-pressure reading, without diagnosis of hypertension: Secondary | ICD-10-CM | POA: Diagnosis not present

## 2023-02-10 DIAGNOSIS — J019 Acute sinusitis, unspecified: Secondary | ICD-10-CM | POA: Diagnosis not present

## 2023-02-10 DIAGNOSIS — J340 Abscess, furuncle and carbuncle of nose: Secondary | ICD-10-CM | POA: Diagnosis not present

## 2023-03-07 DIAGNOSIS — H268 Other specified cataract: Secondary | ICD-10-CM | POA: Diagnosis not present

## 2023-03-07 DIAGNOSIS — H04123 Dry eye syndrome of bilateral lacrimal glands: Secondary | ICD-10-CM | POA: Diagnosis not present

## 2023-05-31 DIAGNOSIS — R7301 Impaired fasting glucose: Secondary | ICD-10-CM | POA: Diagnosis not present

## 2023-05-31 DIAGNOSIS — Z0001 Encounter for general adult medical examination with abnormal findings: Secondary | ICD-10-CM | POA: Diagnosis not present

## 2023-05-31 DIAGNOSIS — E7849 Other hyperlipidemia: Secondary | ICD-10-CM | POA: Diagnosis not present

## 2023-06-01 DIAGNOSIS — E782 Mixed hyperlipidemia: Secondary | ICD-10-CM | POA: Diagnosis not present

## 2023-06-01 DIAGNOSIS — E7800 Pure hypercholesterolemia, unspecified: Secondary | ICD-10-CM | POA: Diagnosis not present

## 2023-06-01 DIAGNOSIS — I10 Essential (primary) hypertension: Secondary | ICD-10-CM | POA: Diagnosis not present

## 2023-06-01 DIAGNOSIS — N189 Chronic kidney disease, unspecified: Secondary | ICD-10-CM | POA: Diagnosis not present

## 2023-06-08 DIAGNOSIS — Z23 Encounter for immunization: Secondary | ICD-10-CM | POA: Diagnosis not present

## 2023-06-08 DIAGNOSIS — R5383 Other fatigue: Secondary | ICD-10-CM | POA: Diagnosis not present

## 2023-06-08 DIAGNOSIS — R7301 Impaired fasting glucose: Secondary | ICD-10-CM | POA: Diagnosis not present

## 2023-06-08 DIAGNOSIS — N189 Chronic kidney disease, unspecified: Secondary | ICD-10-CM | POA: Diagnosis not present

## 2023-06-08 DIAGNOSIS — E7849 Other hyperlipidemia: Secondary | ICD-10-CM | POA: Diagnosis not present

## 2023-06-08 DIAGNOSIS — I10 Essential (primary) hypertension: Secondary | ICD-10-CM | POA: Diagnosis not present

## 2023-06-08 DIAGNOSIS — M25561 Pain in right knee: Secondary | ICD-10-CM | POA: Diagnosis not present

## 2023-06-08 DIAGNOSIS — J301 Allergic rhinitis due to pollen: Secondary | ICD-10-CM | POA: Diagnosis not present

## 2023-06-08 DIAGNOSIS — R0602 Shortness of breath: Secondary | ICD-10-CM | POA: Diagnosis not present

## 2023-09-28 DIAGNOSIS — E7849 Other hyperlipidemia: Secondary | ICD-10-CM | POA: Diagnosis not present

## 2023-09-28 DIAGNOSIS — I10 Essential (primary) hypertension: Secondary | ICD-10-CM | POA: Diagnosis not present

## 2023-09-28 DIAGNOSIS — E7800 Pure hypercholesterolemia, unspecified: Secondary | ICD-10-CM | POA: Diagnosis not present

## 2023-09-28 DIAGNOSIS — R7301 Impaired fasting glucose: Secondary | ICD-10-CM | POA: Diagnosis not present

## 2023-10-05 DIAGNOSIS — E782 Mixed hyperlipidemia: Secondary | ICD-10-CM | POA: Diagnosis not present

## 2023-10-05 DIAGNOSIS — Z780 Asymptomatic menopausal state: Secondary | ICD-10-CM | POA: Diagnosis not present

## 2023-10-05 DIAGNOSIS — R002 Palpitations: Secondary | ICD-10-CM | POA: Diagnosis not present

## 2023-10-19 DIAGNOSIS — M25571 Pain in right ankle and joints of right foot: Secondary | ICD-10-CM | POA: Diagnosis not present

## 2023-12-07 DIAGNOSIS — Z1231 Encounter for screening mammogram for malignant neoplasm of breast: Secondary | ICD-10-CM | POA: Diagnosis not present

## 2024-01-25 DIAGNOSIS — I10 Essential (primary) hypertension: Secondary | ICD-10-CM | POA: Diagnosis not present

## 2024-01-25 DIAGNOSIS — E7849 Other hyperlipidemia: Secondary | ICD-10-CM | POA: Diagnosis not present

## 2024-01-25 DIAGNOSIS — R7301 Impaired fasting glucose: Secondary | ICD-10-CM | POA: Diagnosis not present

## 2024-02-01 DIAGNOSIS — R002 Palpitations: Secondary | ICD-10-CM | POA: Diagnosis not present

## 2024-02-01 DIAGNOSIS — M25571 Pain in right ankle and joints of right foot: Secondary | ICD-10-CM | POA: Diagnosis not present

## 2024-02-01 DIAGNOSIS — Z780 Asymptomatic menopausal state: Secondary | ICD-10-CM | POA: Diagnosis not present

## 2024-02-07 DIAGNOSIS — R21 Rash and other nonspecific skin eruption: Secondary | ICD-10-CM | POA: Diagnosis not present

## 2024-02-29 IMAGING — MG MM DIGITAL SCREENING BILAT W/ TOMO AND CAD
8 series · 8 of 24 positions shown · non-contrast
Comparison: Previous exam(s).

CLINICAL DATA: Screening.

EXAM:
DIGITAL SCREENING BILATERAL MAMMOGRAM WITH TOMOSYNTHESIS AND CAD
TECHNIQUE: Bilateral screening digital craniocaudal and mediolateral oblique
mammograms were obtained. Bilateral screening digital breast
tomosynthesis was performed. The images were evaluated with
computer-aided detection.

[L MLO synth-2D]
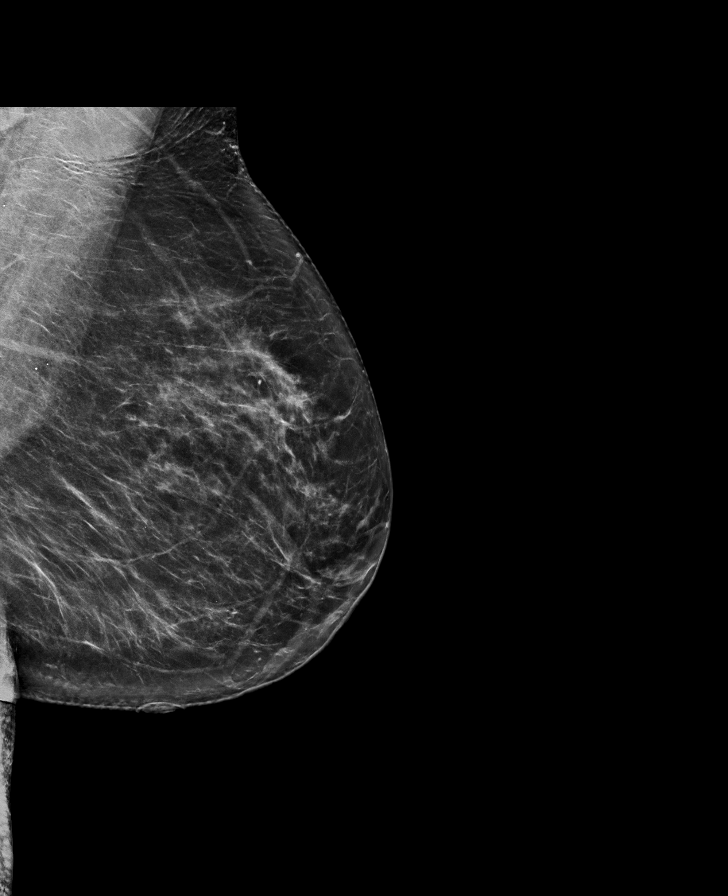

[L CC synth-2D]
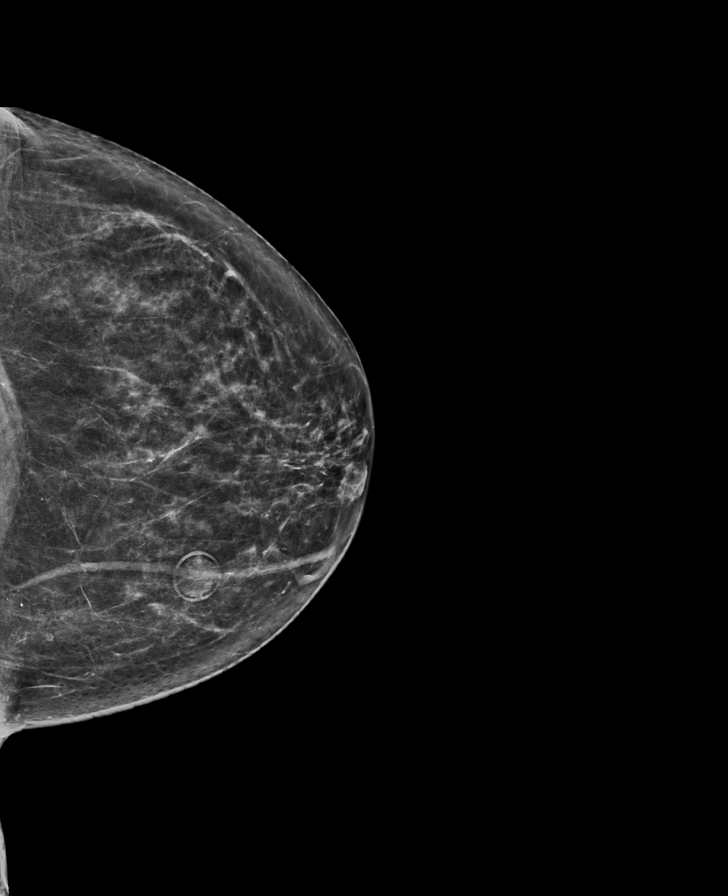

[R MLO synth-2D]
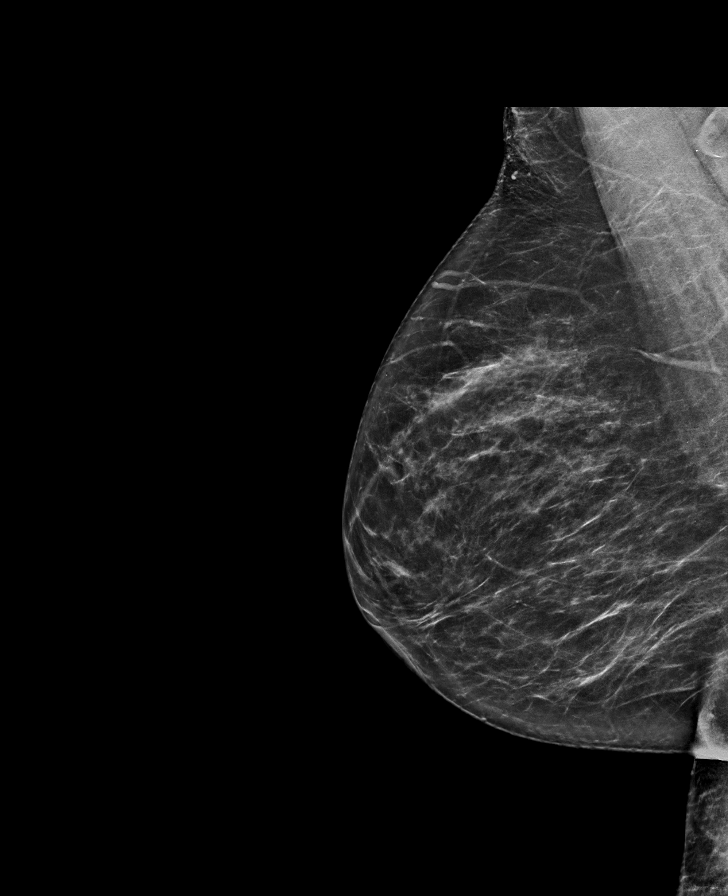

[R CC synth-2D]
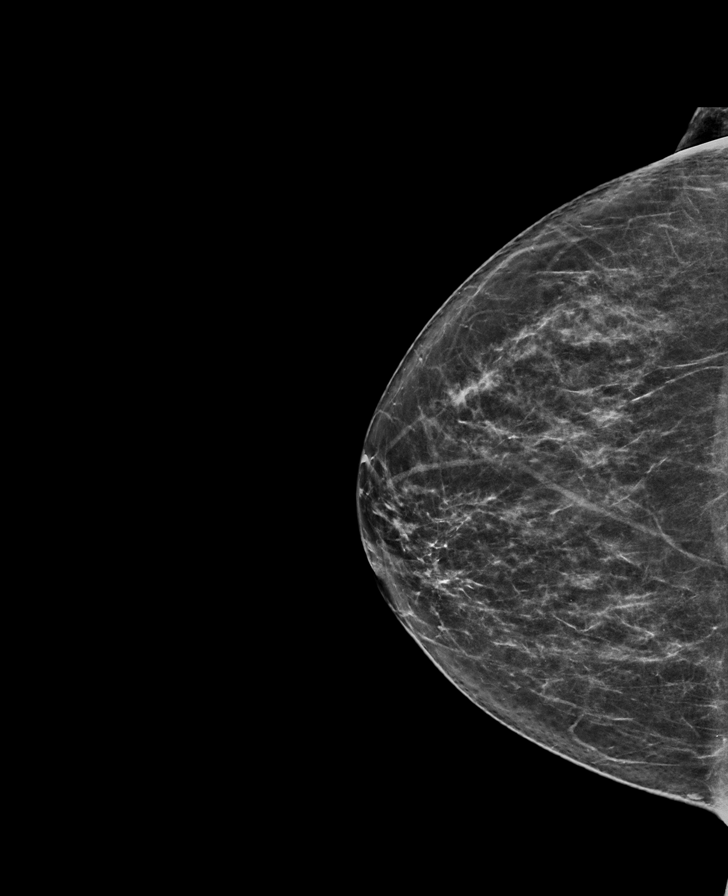

[L CC tomo · tomo slice 37/72.0]
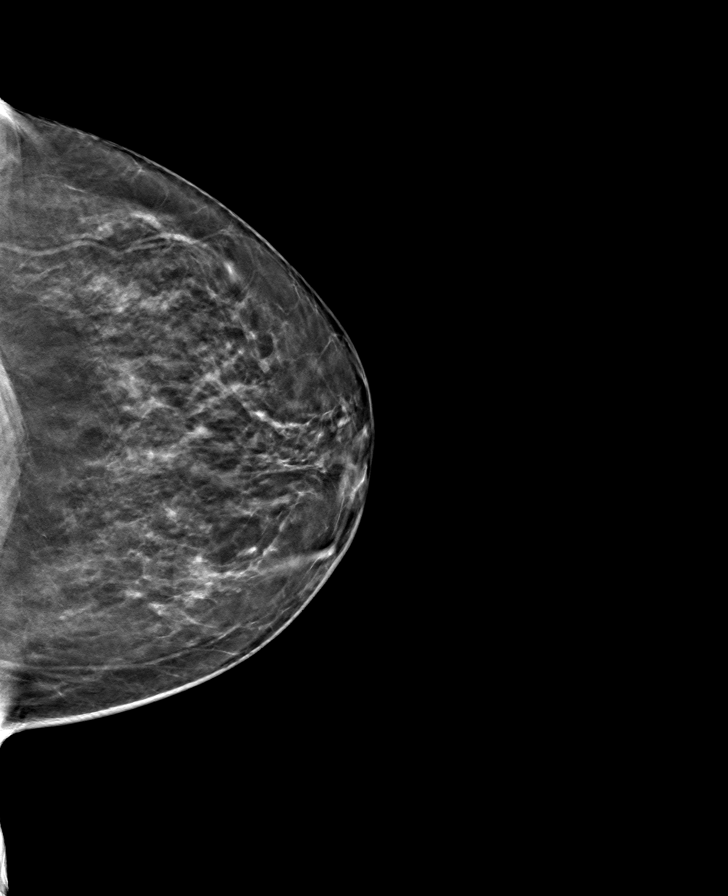

[R MLO tomo · tomo slice 37/72.0]
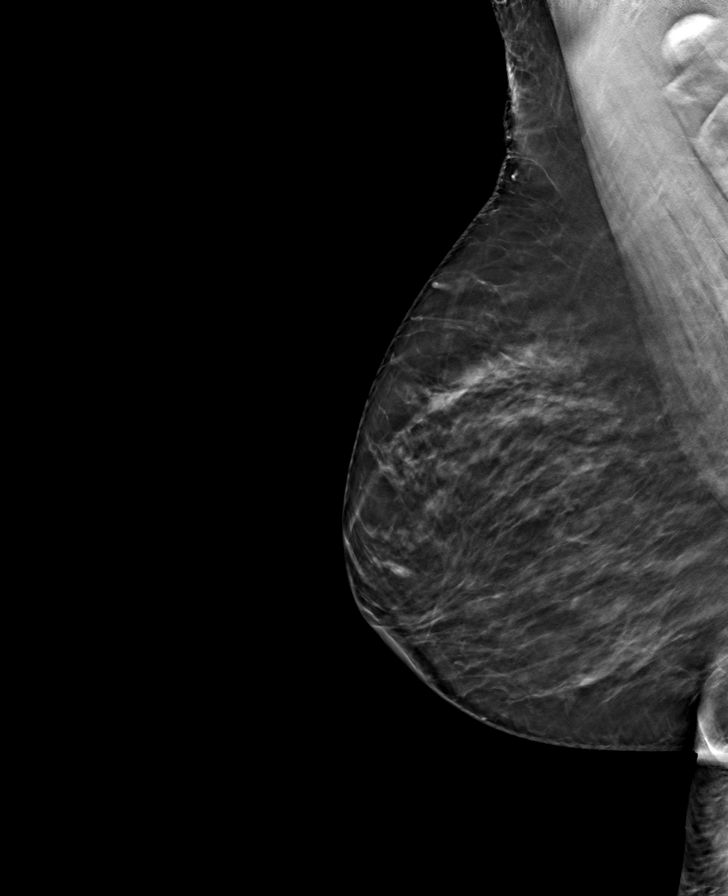

[L MLO tomo · tomo slice 39/78.0]
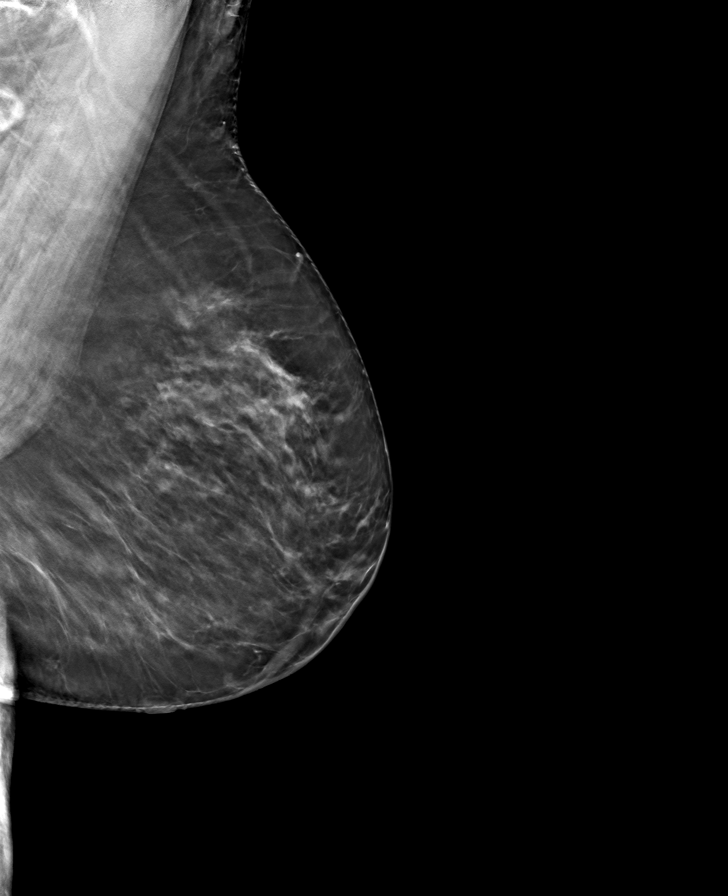

[R CC tomo · tomo slice 35/68.0]
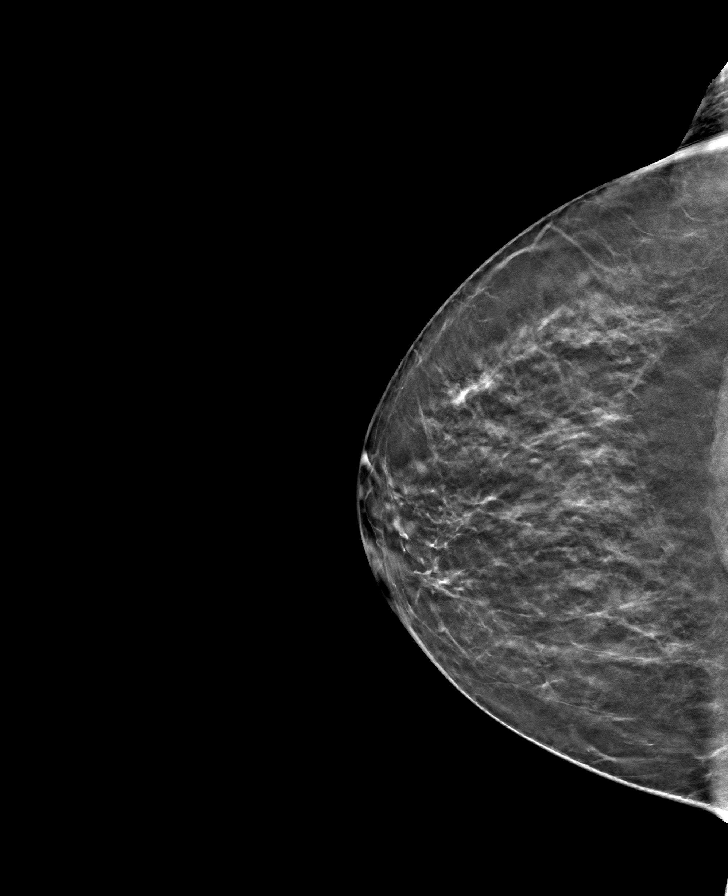

[8 of 24 positions shown; findings below may reference images not displayed]

ACR Breast Density Category b: There are scattered areas of
fibroglandular density.
FINDINGS: There are no findings suspicious for malignancy.
IMPRESSION: No mammographic evidence of malignancy. A result letter of this
screening mammogram will be mailed directly to the patient.

RECOMMENDATION:
Screening mammogram in one year. (Code:51-O-LD2)

BI-RADS CATEGORY  1: Negative.

## 2024-03-09 DIAGNOSIS — W57XXXA Bitten or stung by nonvenomous insect and other nonvenomous arthropods, initial encounter: Secondary | ICD-10-CM | POA: Diagnosis not present

## 2024-03-14 DIAGNOSIS — R21 Rash and other nonspecific skin eruption: Secondary | ICD-10-CM | POA: Diagnosis not present

## 2024-03-14 DIAGNOSIS — Z23 Encounter for immunization: Secondary | ICD-10-CM | POA: Diagnosis not present

## 2024-04-11 DIAGNOSIS — R21 Rash and other nonspecific skin eruption: Secondary | ICD-10-CM | POA: Diagnosis not present

## 2024-10-01 ENCOUNTER — Ambulatory Visit: Admitting: Neurology
